# Patient Record
Sex: Male | Born: 1985 | Race: White | Hispanic: No | Marital: Married | Smoking: Never smoker
Health system: Southern US, Community
[De-identification: ages and names within clinical notes are randomized; demographics above are authoritative.]

## PROBLEM LIST (undated history)

## (undated) DIAGNOSIS — K6289 Other specified diseases of anus and rectum: Secondary | ICD-10-CM

## (undated) DIAGNOSIS — M791 Myalgia, unspecified site: Secondary | ICD-10-CM

## (undated) DIAGNOSIS — R61 Generalized hyperhidrosis: Secondary | ICD-10-CM

## (undated) DIAGNOSIS — F329 Major depressive disorder, single episode, unspecified: Secondary | ICD-10-CM

## (undated) DIAGNOSIS — M549 Dorsalgia, unspecified: Secondary | ICD-10-CM

## (undated) DIAGNOSIS — G8929 Other chronic pain: Secondary | ICD-10-CM

## (undated) DIAGNOSIS — R194 Change in bowel habit: Secondary | ICD-10-CM

## (undated) DIAGNOSIS — R5383 Other fatigue: Secondary | ICD-10-CM

## (undated) DIAGNOSIS — Z803 Family history of malignant neoplasm of breast: Secondary | ICD-10-CM

## (undated) DIAGNOSIS — N390 Urinary tract infection, site not specified: Secondary | ICD-10-CM

## (undated) DIAGNOSIS — R51 Headache: Secondary | ICD-10-CM

## (undated) DIAGNOSIS — Z801 Family history of malignant neoplasm of trachea, bronchus and lung: Secondary | ICD-10-CM

## (undated) DIAGNOSIS — Z800 Family history of malignant neoplasm of digestive organs: Secondary | ICD-10-CM

## (undated) DIAGNOSIS — R112 Nausea with vomiting, unspecified: Secondary | ICD-10-CM

## (undated) DIAGNOSIS — F32A Depression, unspecified: Secondary | ICD-10-CM

## (undated) DIAGNOSIS — R109 Unspecified abdominal pain: Secondary | ICD-10-CM

## (undated) HISTORY — DX: Other fatigue: R53.83

## (undated) HISTORY — DX: Family history of malignant neoplasm of trachea, bronchus and lung: Z80.1

## (undated) HISTORY — DX: Depression, unspecified: F32.A

## (undated) HISTORY — DX: Family history of malignant neoplasm of breast: Z80.3

## (undated) HISTORY — DX: Myalgia, unspecified site: M79.10

## (undated) HISTORY — DX: Generalized hyperhidrosis: R61

## (undated) HISTORY — DX: Urinary tract infection, site not specified: N39.0

## (undated) HISTORY — DX: Unspecified abdominal pain: R10.9

## (undated) HISTORY — DX: Headache: R51

## (undated) HISTORY — DX: Change in bowel habit: R19.4

## (undated) HISTORY — DX: Family history of malignant neoplasm of digestive organs: Z80.0

## (undated) HISTORY — DX: Other specified diseases of anus and rectum: K62.89

## (undated) HISTORY — DX: Major depressive disorder, single episode, unspecified: F32.9

## (undated) HISTORY — DX: Nausea with vomiting, unspecified: R11.2

---

## 2011-10-23 ENCOUNTER — Ambulatory Visit (INDEPENDENT_AMBULATORY_CARE_PROVIDER_SITE_OTHER): Payer: BC Managed Care – PPO | Admitting: Family Medicine

## 2011-10-23 ENCOUNTER — Encounter: Payer: Self-pay | Admitting: Family Medicine

## 2011-10-23 VITALS — BP 120/84 | HR 80 | Temp 98.4°F | Resp 12 | Ht 71.0 in | Wt 184.0 lb

## 2011-10-23 DIAGNOSIS — Z0000 Encounter for general adult medical examination without abnormal findings: Secondary | ICD-10-CM

## 2011-10-23 DIAGNOSIS — G43909 Migraine, unspecified, not intractable, without status migrainosus: Secondary | ICD-10-CM | POA: Insufficient documentation

## 2011-10-23 DIAGNOSIS — Z23 Encounter for immunization: Secondary | ICD-10-CM

## 2011-10-23 DIAGNOSIS — J310 Chronic rhinitis: Secondary | ICD-10-CM

## 2011-10-23 DIAGNOSIS — Z8659 Personal history of other mental and behavioral disorders: Secondary | ICD-10-CM | POA: Insufficient documentation

## 2011-10-23 LAB — CBC WITH DIFFERENTIAL/PLATELET
Basophils Relative: 0.4 % (ref 0.0–3.0)
Eosinophils Absolute: 0.2 10*3/uL (ref 0.0–0.7)
Eosinophils Relative: 3.1 % (ref 0.0–5.0)
Lymphocytes Relative: 25.3 % (ref 12.0–46.0)
MCHC: 34.1 g/dL (ref 30.0–36.0)
MCV: 91.1 fl (ref 78.0–100.0)
Monocytes Absolute: 0.5 10*3/uL (ref 0.1–1.0)
Neutrophils Relative %: 63.5 % (ref 43.0–77.0)
Platelets: 267 10*3/uL (ref 150.0–400.0)
RBC: 5.19 Mil/uL (ref 4.22–5.81)
WBC: 6.8 10*3/uL (ref 4.5–10.5)

## 2011-10-23 LAB — BASIC METABOLIC PANEL
BUN: 10 mg/dL (ref 6–23)
Chloride: 103 mEq/L (ref 96–112)
GFR: 119.5 mL/min (ref >60.00)
Glucose, Bld: 83 mg/dL (ref 70–99)
Potassium: 4.3 mEq/L (ref 3.5–5.1)
Sodium: 142 mEq/L (ref 135–145)

## 2011-10-23 LAB — LIPID PANEL
HDL: 33.6 mg/dL — ABNORMAL LOW (ref >39.00)
LDL Cholesterol: 103 mg/dL — ABNORMAL HIGH (ref 0–99)
VLDL: 35 mg/dL (ref 0.0–40.0)

## 2011-10-23 LAB — TSH: TSH: 1.51 u[IU]/mL (ref 0.35–5.50)

## 2011-10-23 LAB — HEPATIC FUNCTION PANEL
Bilirubin, Direct: 0.1 mg/dL (ref 0.0–0.3)
Total Bilirubin: 0.6 mg/dL (ref 0.3–1.2)

## 2011-10-23 MED ORDER — FLUTICASONE PROPIONATE 50 MCG/ACT NA SUSP: 2.0000 | Freq: Every day | NASAL | Status: DC

## 2011-10-23 NOTE — Progress Notes (Signed)
  Subjective:    Patient ID: Mark Hopkins, male    DOB: 1986/03/28, 26 y.o.   MRN: [id number]  HPI  New patient to establish care for complete physical. Past medical history reviewed. Past history of depression currently stable off medication. History of migraine headaches usually releived with over-the-counter medication. No other chronic medical problems. No prior surgeries.  Mother had some type of palpitations possible arrhythmia. Father had coronary disease with bypass age 37. Both were smokers. Both parents with history of alcohol abuse  Patient is married. No children. Nonsmoker. Only social occasional alcohol use. No regular exercise. Plans to start soon.  He has frequent nasal congestion usually alternating right and left nostril. No nasal discharge. Symptoms are year-round. Not relieved with nasal saline or antihistamine.  Last tetanus unknown   Review of Systems  Constitutional: Negative for fever, activity change, appetite change and fatigue.  HENT: Negative for ear pain, congestion and trouble swallowing.   Eyes: Negative for pain and visual disturbance.  Respiratory: Negative for cough, shortness of breath and wheezing.   Cardiovascular: Negative for chest pain and palpitations.  Gastrointestinal: Negative for nausea, vomiting, abdominal pain, diarrhea, constipation, blood in stool, abdominal distention and rectal pain.  Genitourinary: Negative for dysuria, hematuria and testicular pain.  Musculoskeletal: Negative for joint swelling and arthralgias.  Skin: Negative for rash.  Neurological: Negative for dizziness, syncope and headaches.  Hematological: Negative for adenopathy.  Psychiatric/Behavioral: Negative for confusion and dysphoric mood.       Objective:   Physical Exam  Constitutional: He is oriented to person, place, and time. He appears well-developed and well-nourished. No distress.  HENT:  Head: Normocephalic and atraumatic.  Right Ear: External ear  normal.  Left Ear: External ear normal.  Mouth/Throat: Oropharynx is clear and moist.  Eyes: Conjunctivae and EOM are normal. Pupils are equal, round, and reactive to light.  Neck: Normal range of motion. Neck supple. No thyromegaly present.  Cardiovascular: Normal rate, regular rhythm and normal heart sounds.   No murmur heard. Pulmonary/Chest: No respiratory distress. He has no wheezes. He has no rales.  Abdominal: Soft. Bowel sounds are normal. He exhibits no distension and no mass. There is no tenderness. There is no rebound and no guarding.  Musculoskeletal: He exhibits no edema.  Lymphadenopathy:    He has no cervical adenopathy.  Neurological: He is alert and oriented to person, place, and time. He displays normal reflexes. No cranial nerve deficit.  Skin: No rash noted.  Psychiatric: He has a normal mood and affect.          Assessment & Plan:  Healthy 26 year old male. Needs tetanus booster. Obtain screening lab work. Flonase for nasal congestion symptoms. Establish more regular exercise.

## 2011-10-23 NOTE — Patient Instructions (Signed)
Establish more consistent exercise

## 2011-10-25 NOTE — Progress Notes (Signed)
Quick Note:  Pt informed ______ 

## 2012-04-20 ENCOUNTER — Encounter: Payer: Self-pay | Admitting: Family Medicine

## 2012-04-20 ENCOUNTER — Ambulatory Visit (INDEPENDENT_AMBULATORY_CARE_PROVIDER_SITE_OTHER): Payer: BC Managed Care – PPO | Admitting: Family Medicine

## 2012-04-20 VITALS — BP 120/80 | HR 100 | Temp 98.3°F | Wt 179.0 lb

## 2012-04-20 DIAGNOSIS — J069 Acute upper respiratory infection, unspecified: Secondary | ICD-10-CM

## 2012-04-20 MED ORDER — FLUTICASONE PROPIONATE 50 MCG/ACT NA SUSP: 2.0000 | Freq: Every day | NASAL | Status: DC

## 2012-04-20 MED ORDER — BENZONATATE 100 MG PO CAPS: 100.0000 mg | ORAL_CAPSULE | Freq: Two times a day (BID) | ORAL | Status: DC | PRN

## 2012-04-20 NOTE — Progress Notes (Signed)
Chief Complaint  Patient presents with  . Sore Throat    cough, chest hurts, body ahces, chills since Sunday     HPI:  URI: -started 2 days ago -symptoms: cough, drainage, nasal congestion, cp from coughing, hoarseness, scratchy sore throat, chills on and off, body aches he thinks from coughing -Denies: fevers, NVD, tooth pain, ear pain -has only tried cough drops  ROS: See pertinent positives and negatives per HPI.  Past Medical History  Diagnosis Date  . Depression   . Headache   . Urinary tract infection   . Migraine     Family History  Problem Relation Age of Onset  . Alcohol abuse Mother   . Mental illness Mother   . Heart disease Mother     palpitations  . Alcohol abuse Father   . Heart disease Father 37    CAD  . Alcohol abuse Maternal Grandmother   . Alcohol abuse Maternal Grandfather   . Alcohol abuse Paternal Grandmother   . Alcohol abuse Paternal Grandfather     History   Social History  . Marital Status: Married    Spouse Name: N/A    Number of Children: N/A  . Years of Education: N/A   Social History Main Topics  . Smoking status: Never Smoker   . Smokeless tobacco: None  . Alcohol Use: None  . Drug Use: None  . Sexually Active: None   Other Topics Concern  . None   Social History Narrative  . None    Current outpatient prescriptions:benzonatate (TESSALON) 100 MG capsule, Take 1 capsule (100 mg total) by mouth 2 (two) times daily as needed for cough., Disp: 20 capsule, Rfl: 0;  fluticasone (FLONASE) 50 MCG/ACT nasal spray, Place 2 sprays into the nose daily., Disp: 16 g, Rfl: 1;  DISCONTD: fluticasone (FLONASE) 50 MCG/ACT nasal spray, Place 2 sprays into the nose daily., Disp: 16 g, Rfl: 6  EXAM:  Filed Vitals:   04/20/12 0913  BP: 120/80  Pulse: 100  Temp: 98.3 F (36.8 C)    There is no height on file to calculate BMI.  GENERAL: vitals reviewed and listed above, alert, oriented, appears well hydrated and in no acute  distress  HEENT: atraumatic, conjunttiva clear, no obvious abnormalities on inspection of external nose and ears, normal ear canals and TMs, clear nasal congestion, PND, post oropharyngeal erythema  NECK: no obvious masses on inspection  LUNGS: clear to auscultation bilaterally, no wheezes, rales or rhonchi, good air movement  CV: HRRR, no peripheral edema  MS: moves all extremities without noticeable abnormality  PSYCH: pleasant and cooperative, no obvious depression or anxiety  ASSESSMENT AND PLAN:  Discussed the following assessment and plan:  1. Viral upper respiratory illness  fluticasone (FLONASE) 50 MCG/ACT nasal spray, benzonatate (TESSALON) 100 MG capsule    -Patient advised to return or notify a doctor immediately if symptoms worsen or persist or new concerns arise.  Patient Instructions  NSTRUCTIONS FOR UPPER RESPIRATORY INFECTION:  -plenty of rest and fluids  -nasal saline wash 2-3 times daily (use prepackaged nasal saline or bottled/distilled water if making your own)   -clean nose with nasal saline before using the nasal steroid or sinex  -can use sinex nasal spray for drainage and nasal congestion - but do NOT use longer then 3-4 days  -can use tylenol or ibuprofen as directed for aches and sorethroat  -in the winter time, using a humidifier at night is helpful (please follow cleaning instructions)  -if you are  taking a cough medication - use only as directed, may also try a teaspoon of honey to coat the throat and throat lozenges  -for sore throat, salt water gargles can help  -follow up if you have fevers, are worsening or not getting better in 5-7 days      KIM, HANNAH R.

## 2012-04-20 NOTE — Patient Instructions (Signed)
NSTRUCTIONS FOR UPPER RESPIRATORY INFECTION:  -plenty of rest and fluids  -nasal saline wash 2-3 times daily (use prepackaged nasal saline or bottled/distilled water if making your own)   -clean nose with nasal saline before using the nasal steroid or sinex  -can use sinex nasal spray for drainage and nasal congestion - but do NOT use longer then 3-4 days  -can use tylenol or ibuprofen as directed for aches and sorethroat  -in the winter time, using a humidifier at night is helpful (please follow cleaning instructions)  -if you are taking a cough medication - use only as directed, may also try a teaspoon of honey to coat the throat and throat lozenges  -for sore throat, salt water gargles can help  -follow up if you have fevers, are worsening or not getting better in 5-7 days

## 2014-05-02 ENCOUNTER — Ambulatory Visit: Payer: Self-pay

## 2015-03-01 ENCOUNTER — Emergency Department (HOSPITAL_COMMUNITY)
Admission: EM | Admit: 2015-03-01 | Discharge: 2015-03-01 | Disposition: A | Discharge status: Home / Self Care | Payer: BLUE CROSS/BLUE SHIELD | Attending: Emergency Medicine | Admitting: Emergency Medicine

## 2015-03-01 ENCOUNTER — Encounter (HOSPITAL_COMMUNITY): Payer: Self-pay | Admitting: Emergency Medicine

## 2015-03-01 DIAGNOSIS — G8929 Other chronic pain: Secondary | ICD-10-CM | POA: Insufficient documentation

## 2015-03-01 DIAGNOSIS — M545 Low back pain, unspecified: Secondary | ICD-10-CM

## 2015-03-01 HISTORY — DX: Other chronic pain: G89.29

## 2015-03-01 HISTORY — DX: Dorsalgia, unspecified: M54.9

## 2015-03-01 MED ORDER — PREDNISONE 10 MG PO TABS: 20.0000 mg | ORAL_TABLET | Freq: Every day | ORAL | Status: DC

## 2015-03-01 MED ORDER — CYCLOBENZAPRINE HCL 10 MG PO TABS: 5.0000 mg | ORAL_TABLET | Freq: Two times a day (BID) | ORAL | Status: DC | PRN

## 2015-03-01 MED ORDER — DIAZEPAM 5 MG PO TABS
5.0000 mg | ORAL_TABLET | Freq: Once | ORAL | Status: AC
  Administered 2015-03-01: 5 mg via ORAL
  Filled 2015-03-01: qty 1

## 2015-03-01 MED ORDER — KETOROLAC TROMETHAMINE 60 MG/2ML IM SOLN
60.0000 mg | Freq: Once | INTRAMUSCULAR | Status: AC
  Administered 2015-03-01: 60 mg via INTRAMUSCULAR
  Filled 2015-03-01: qty 2

## 2015-03-01 NOTE — ED Notes (Addendum)
Patient states low to mid back pain and 2 weeks.   Denies injury.   Patient states has not been taking any medicine at home.   Patient states 8/10 pain "feels like electric shocks".  Patient states "i have had chronic back pain since I was a teenager".

## 2015-03-01 NOTE — ED Provider Notes (Signed)
CSN: [id number]     Arrival date & time 03/01/15  1030 History  This chart was scribed for non-physician practitioner, Linus Mako, working with Blanchie Dessert, MD by Evelene Croon, ED Scribe. This patient was seen in room TR07C/TR07C and the patient's care was started at 11:32 AM.    Chief Complaint  Patient presents with  . Back Pain    The history is provided by the patient. No language interpreter was used.     HPI Comments:  Mark Hopkins is a 29 y.o. male who presents to the Emergency Department complaining of lower back pain for a few weeks. He describes his pain as  episodic "shocks" that shoot downwards but does not go into his BLE. He notes sitting and laying supine exacerbates his pain; while standing and laying on a harder surface mildly improves the pain. He denies urinary symptoms, bowel/bladder incontinence, numbness and weaknss to his BLE. Pt has a h/o chronic back pain but notes his pain today is different. He also reports 2 episodes of vertigo PTA and at the time his pain was ~ 7/10 and at its peak of intensity.   Past Medical History  Diagnosis Date  . Chronic back pain    History reviewed. No pertinent past surgical history. No family history on file. Social History  Substance Use Topics  . Smoking status: Never Smoker   . Smokeless tobacco: None  . Alcohol Use: Yes     Comment: socially    Review of Systems  Genitourinary: Negative for dysuria.  Neurological: Positive for dizziness. Negative for weakness and numbness.  All other systems reviewed and are negative.    Allergies  Review of patient's allergies indicates no known allergies.  Home Medications   Prior to Admission medications   Medication Sig Start Date End Date Taking? Authorizing Provider  cyclobenzaprine (FLEXERIL) 10 MG tablet Take 0.5-1 tablets (5-10 mg total) by mouth 2 (two) times daily as needed for muscle spasms. 03/01/15   Thomasina Housley Carlota Raspberry, PA-C  predniSONE (DELTASONE) 10  MG tablet Take 2 tablets (20 mg total) by mouth daily. 03/01/15   Ceazia Harb Carlota Raspberry, PA-C   BP 131/100 mmHg  Pulse 86  Temp(Src) 97.9 F (36.6 C) (Oral)  Resp 12  Ht 5\' 11"  (1.803 m)  Wt 185 lb (83.915 kg)  BMI 25.81 kg/m2  SpO2 100% Physical Exam  Constitutional: He is oriented to person, place, and time. He appears well-developed and well-nourished. No distress.  HENT:  Head: Normocephalic and atraumatic.  Eyes: Conjunctivae are normal.  Neck: Normal range of motion.  Cardiovascular: Normal rate.   Pulmonary/Chest: Effort normal.  Musculoskeletal: Normal range of motion.  Pt has symmetrical and physiologic strength to bilateral lower extremities.  Neurosensory function adequate to both legs Skin color is normal. Skin is warm and moist.  No step off deformity appreciated and she has no midline bony tenderness.  Pt is able to ambulate but with some discomfort.  No crepitus, laceration, effusion, induration, lesions,  appreciated Pedal pulses are symmetrical and palpable bilaterally  he exhibits tenderness to the paraspinal region but non midline   Neurological: He is alert and oriented to person, place, and time.  Skin: Skin is warm and dry.  Psychiatric: He has a normal mood and affect. His behavior is normal.  Nursing note and vitals reviewed.   ED Course  Procedures   DIAGNOSTIC STUDIES:  Oxygen Saturation is 100% on RA, normal by my interpretation.    COORDINATION OF CARE:  11:36 AM Will administer dose of PO Valium and a shot of toradol. Will discharge with RX for prednisone and muscle relaxers, Tylenol at home for pain and referral to Ortho. Discussed treatment plan with pt at bedside and pt agreed to plan.  Labs Review Labs Reviewed - No data to display  Imaging Review No results found. I have personally reviewed and evaluated these images and lab results as part of my medical decision-making.   EKG Interpretation None      MDM   Final diagnoses:   Midline low back pain without sciatica   29 y.o.Mark Hopkins's  with back pain.   No neurological deficits and normal neuro exam. No loss of bowel or bladder control. No concern for cauda equina at this time base on HPI and physical exam findings. No fever, night sweats, weight loss, h/o cancer, IVDU. The patient can walk with some discomfort.   Patient Plan 1. Medications: NSAIDs and/or muscle relaxer. Cont usual home medications unless otherwise directed. 2. Treatment: rest, drink plenty of fluids, gentle stretching as discussed, alternate ice and heat  3. Follow Up: Please followup with your primary doctor for discussion of your diagnoses and further evaluation after today's visit; if you do not have a primary care doctor use the resource guide provided to find one  Advised to follow-up with the orthopedist if symptoms do not start to resolve in the next 2-3 days. If develop loss of bowel or urinary control return to the ED as soon as possible for further evaluation. To take the medications as prescribed as they can cause harm if not taken appropriately.   Vital signs are stable at discharge. Filed Vitals:   03/01/15 1142  BP: 137/102  Pulse: 82  Temp:   Resp: 14    Patient/guardian has voiced understanding and agreed to follow-up with the PCP or specialist.   I personally performed the services described in this documentation, which was scribed in my presence. The recorded information has been reviewed and is accurate.   Delos Haring, PA-C 03/01/15 1143  Blanchie Dessert, MD 03/01/15 2230

## 2015-03-01 NOTE — Discharge Instructions (Signed)
Back Pain, Adult Low back pain is very common. About 1 in 5 people have back pain.The cause of low back pain is rarely dangerous. The pain often gets better over time.About half of people with a sudden onset of back pain feel better in just 2 weeks. About 8 in 10 people feel better by 6 weeks.  CAUSES Some common causes of back pain include:  Strain of the muscles or ligaments supporting the spine.  Wear and tear (degeneration) of the spinal discs.  Arthritis.  Direct injury to the back. DIAGNOSIS Most of the time, the direct cause of low back pain is not known.However, back pain can be treated effectively even when the exact cause of the pain is unknown.Answering your caregiver's questions about your overall health and symptoms is one of the most accurate ways to make sure the cause of your pain is not dangerous. If your caregiver needs more information, he or she may order lab work or imaging tests (X-rays or MRIs).However, even if imaging tests show changes in your back, this usually does not require surgery. HOME CARE INSTRUCTIONS For many people, back pain returns.Since low back pain is rarely dangerous, it is often a condition that people can learn to manageon their own.   Remain active. It is stressful on the back to sit or stand in one place. Do not sit, drive, or stand in one place for more than 30 minutes at a time. Take short walks on level surfaces as soon as pain allows.Try to increase the length of time you walk each day.  Do not stay in bed.Resting more than 1 or 2 days can delay your recovery.  Do not avoid exercise or work.Your body is made to move.It is not dangerous to be active, even though your back may hurt.Your back will likely heal faster if you return to being active before your pain is gone.  Pay attention to your body when you bend and lift. Many people have less discomfortwhen lifting if they bend their knees, keep the load close to their bodies,and  avoid twisting. Often, the most comfortable positions are those that put less stress on your recovering back.  Find a comfortable position to sleep. Use a firm mattress and lie on your side with your knees slightly bent. If you lie on your back, put a pillow under your knees.  Only take over-the-counter or prescription medicines as directed by your caregiver. Over-the-counter medicines to reduce pain and inflammation are often the most helpful.Your caregiver may prescribe muscle relaxant drugs.These medicines help dull your pain so you can more quickly return to your normal activities and healthy exercise.  Put ice on the injured area.  Put ice in a plastic bag.  Place a towel between your skin and the bag.  Leave the ice on for 15-20 minutes, 03-04 times a day for the first 2 to 3 days. After that, ice and heat may be alternated to reduce pain and spasms.  Ask your caregiver about trying back exercises and gentle massage. This may be of some benefit.  Avoid feeling anxious or stressed.Stress increases muscle tension and can worsen back pain.It is important to recognize when you are anxious or stressed and learn ways to manage it.Exercise is a great option. SEEK MEDICAL CARE IF:  You have pain that is not relieved with rest or medicine.  You have pain that does not improve in 1 week.  You have new symptoms.  You are generally not feeling well. SEEK   IMMEDIATE MEDICAL CARE IF:   You have pain that radiates from your back into your legs.  You develop new bowel or bladder control problems.  You have unusual weakness or numbness in your arms or legs.  You develop nausea or vomiting.  You develop abdominal pain.  You feel faint. Document Released: 06/24/2005 Document Revised: 12/24/2011 Document Reviewed: 10/26/2013 Exit[employer] Patient Information 2015 [employer]. This information is not intended to replace advice given to you by your health care provider. Make sure you  discuss any questions you have with your health care provider.  

## 2015-03-02 ENCOUNTER — Encounter: Payer: Self-pay | Admitting: Family Medicine

## 2015-08-02 ENCOUNTER — Emergency Department (HOSPITAL_COMMUNITY)
Admission: EM | Admit: 2015-08-02 | Discharge: 2015-08-02 | Disposition: A | Discharge status: Home / Self Care | Payer: BLUE CROSS/BLUE SHIELD | Attending: Emergency Medicine | Admitting: Emergency Medicine

## 2015-08-02 ENCOUNTER — Emergency Department (HOSPITAL_COMMUNITY): Payer: BLUE CROSS/BLUE SHIELD

## 2015-08-02 ENCOUNTER — Encounter (HOSPITAL_COMMUNITY): Payer: Self-pay | Admitting: Neurology

## 2015-08-02 DIAGNOSIS — N12 Tubulo-interstitial nephritis, not specified as acute or chronic: Secondary | ICD-10-CM | POA: Insufficient documentation

## 2015-08-02 DIAGNOSIS — R319 Hematuria, unspecified: Secondary | ICD-10-CM | POA: Diagnosis present

## 2015-08-02 DIAGNOSIS — Z8659 Personal history of other mental and behavioral disorders: Secondary | ICD-10-CM | POA: Diagnosis not present

## 2015-08-02 DIAGNOSIS — Z8679 Personal history of other diseases of the circulatory system: Secondary | ICD-10-CM | POA: Diagnosis not present

## 2015-08-02 DIAGNOSIS — Z87440 Personal history of urinary (tract) infections: Secondary | ICD-10-CM | POA: Insufficient documentation

## 2015-08-02 DIAGNOSIS — G8929 Other chronic pain: Secondary | ICD-10-CM | POA: Diagnosis not present

## 2015-08-02 DIAGNOSIS — R109 Unspecified abdominal pain: Secondary | ICD-10-CM

## 2015-08-02 LAB — I-STAT CHEM 8, ED
BUN: 10 mg/dL (ref 6–20)
CALCIUM ION: 1.16 mmol/L (ref 1.12–1.23)
CREATININE: 0.9 mg/dL (ref 0.61–1.24)
Chloride: 100 mmol/L — ABNORMAL LOW (ref 101–111)
Glucose, Bld: 89 mg/dL (ref 65–99)
HEMATOCRIT: 50 % (ref 39.0–52.0)
Hemoglobin: 17 g/dL (ref 13.0–17.0)
Potassium: 4.3 mmol/L (ref 3.5–5.1)
Sodium: 139 mmol/L (ref 135–145)
TCO2: 24 mmol/L (ref 0–100)

## 2015-08-02 LAB — COMPREHENSIVE METABOLIC PANEL
ALT: 23 U/L (ref 17–63)
AST: 20 U/L (ref 15–41)
Albumin: 3.8 g/dL (ref 3.5–5.0)
Alkaline Phosphatase: 68 U/L (ref 38–126)
Anion gap: 10 (ref 5–15)
BUN: 8 mg/dL (ref 6–20)
CHLORIDE: 102 mmol/L (ref 101–111)
CO2: 27 mmol/L (ref 22–32)
Calcium: 9.3 mg/dL (ref 8.9–10.3)
Creatinine, Ser: 0.98 mg/dL (ref 0.61–1.24)
GFR calc Af Amer: >60 mL/min (ref >60)
GFR calc non Af Amer: >60 mL/min (ref >60)
Glucose, Bld: 90 mg/dL (ref 65–99)
Potassium: 4.1 mmol/L (ref 3.5–5.1)
SODIUM: 139 mmol/L (ref 135–145)
Total Bilirubin: 0.6 mg/dL (ref 0.3–1.2)
Total Protein: 7.5 g/dL (ref 6.5–8.1)

## 2015-08-02 LAB — URINALYSIS, ROUTINE W REFLEX MICROSCOPIC
Glucose, UA: NEGATIVE mg/dL
Ketones, ur: 15 mg/dL — AB
Nitrite: POSITIVE — AB
PROTEIN: 100 mg/dL — AB
Specific Gravity, Urine: 1.034 — ABNORMAL HIGH (ref 1.005–1.030)
pH: 6 (ref 5.0–8.0)

## 2015-08-02 LAB — URINE MICROSCOPIC-ADD ON

## 2015-08-02 MED ORDER — SODIUM CHLORIDE 0.9 % IV BOLUS (SEPSIS)
1000.0000 mL | Freq: Once | INTRAVENOUS | Status: AC
  Administered 2015-08-02: 1000 mL via INTRAVENOUS

## 2015-08-02 MED ORDER — ONDANSETRON 4 MG PO TBDP: 4.0000 mg | ORAL_TABLET | Freq: Three times a day (TID) | ORAL | Status: DC | PRN

## 2015-08-02 MED ORDER — CEPHALEXIN 500 MG PO CAPS: 500.0000 mg | ORAL_CAPSULE | Freq: Three times a day (TID) | ORAL | Status: AC

## 2015-08-02 NOTE — ED Notes (Signed)
Reports this morning a few drops of blood in his urine, had upper back pain starting yesterday.

## 2015-08-02 NOTE — ED Notes (Signed)
PT transported to CT at this time.

## 2015-08-02 NOTE — ED Provider Notes (Signed)
CSN: [id number]     Arrival date & time 08/02/15  0932 History   First MD Initiated Contact with Patient 08/02/15 0957     Chief Complaint  Patient presents with  . Hematuria     (Consider location/radiation/quality/duration/timing/severity/associated sxs/prior Treatment) HPI Comments: Back pain x1 day Hematuria this AM R lower thoracic/flank pain Pain 8/10 yesterday and shoot to right buttock Now 3/10 not radiating No numbness/tingling +dysuria No discharge, pain with intercourse No prior testing for STIs Back pain in past, was bilateral, spinal pain Yesterday took hydrocodone left over from other dr. Visit, helped   Patient is a 30 y.o. male presenting with hematuria.  Hematuria Pertinent negatives include no chest pain, no abdominal pain, no headaches and no shortness of breath.    Past Medical History  Diagnosis Date  . Depression   . Headache(784.0)   . Urinary tract infection   . Migraine   . Chronic back pain    History reviewed. No pertinent past surgical history. Family History  Problem Relation Age of Onset  . Alcohol abuse Mother   . Mental illness Mother   . Heart disease Mother     palpitations  . Alcohol abuse Father   . Heart disease Father 72    CAD  . Alcohol abuse Maternal Grandmother   . Alcohol abuse Maternal Grandfather   . Alcohol abuse Paternal Grandmother   . Alcohol abuse Paternal Grandfather    Social History  Substance Use Topics  . Smoking status: Never Smoker   . Smokeless tobacco: None  . Alcohol Use: Yes     Comment: socially    Review of Systems  Constitutional: Negative for fever.  HENT: Negative for sore throat.   Eyes: Negative for visual disturbance.  Respiratory: Negative for shortness of breath.   Cardiovascular: Negative for chest pain.  Gastrointestinal: Negative for nausea, vomiting, abdominal pain and diarrhea.  Genitourinary: Positive for dysuria, hematuria and flank pain. Negative for difficulty urinating.   Musculoskeletal: Positive for back pain. Negative for neck stiffness.  Skin: Negative for rash.  Neurological: Negative for syncope and headaches.      Allergies  Review of patient's allergies indicates no known allergies.  Home Medications   Prior to Admission medications   Medication Sig Start Date End Date Taking? Authorizing Provider  HYDROcodone-acetaminophen (NORCO/VICODIN) 5-325 MG tablet Take 1 tablet by mouth every 6 (six) hours as needed for moderate pain.   Yes Historical Provider, MD  ibuprofen (ADVIL,MOTRIN) 400 MG tablet Take 400 mg by mouth every 6 (six) hours as needed for mild pain.   Yes Historical Provider, MD  cephALEXin (KEFLEX) 500 MG capsule Take 1 capsule (500 mg total) by mouth 3 (three) times daily. 08/02/15 08/16/15  Gareth Morgan, MD  ondansetron (ZOFRAN ODT) 4 MG disintegrating tablet Take 1 tablet (4 mg total) by mouth every 8 (eight) hours as needed for nausea or vomiting. 08/02/15   Gareth Morgan, MD   BP 133/82 mmHg  Pulse 98  Temp(Src) 98.5 F (36.9 C) (Oral)  Resp 16  SpO2 96% Physical Exam  Constitutional: He is oriented to person, place, and time. He appears well-developed and well-nourished. No distress.  HENT:  Head: Normocephalic and atraumatic.  Eyes: Conjunctivae and EOM are normal.  Neck: Normal range of motion.  Cardiovascular: Normal rate, regular rhythm, normal heart sounds and intact distal pulses.  Exam reveals no gallop and no friction rub.   No murmur heard. Pulmonary/Chest: Effort normal and breath sounds normal. No respiratory  distress. He has no wheezes. He has no rales.  Abdominal: Soft. He exhibits no distension. There is no tenderness. There is CVA tenderness (R). There is no guarding.  Musculoskeletal: He exhibits no edema.  Neurological: He is alert and oriented to person, place, and time.  Skin: Skin is warm and dry. He is not diaphoretic.  Nursing note and vitals reviewed.   ED Course  Procedures (including  critical care time) Labs Review Labs Reviewed  URINALYSIS, ROUTINE W REFLEX MICROSCOPIC (NOT AT [facility]) - Abnormal; Notable for the following:    Color, Urine RED (*)    APPearance TURBID (*)    Specific Gravity, Urine 1.034 (*)    Hgb urine dipstick LARGE (*)    Bilirubin Urine MODERATE (*)    Ketones, ur 15 (*)    Protein, ur 100 (*)    Nitrite POSITIVE (*)    Leukocytes, UA LARGE (*)    All other components within normal limits  URINE MICROSCOPIC-ADD ON - Abnormal; Notable for the following:    Squamous Epithelial / LPF 0-5 (*)    Bacteria, UA MANY (*)    All other components within normal limits  I-STAT CHEM 8, ED - Abnormal; Notable for the following:    Chloride 100 (*)    All other components within normal limits  URINE CULTURE  COMPREHENSIVE METABOLIC PANEL  GC/CHLAMYDIA PROBE AMP ([facility]) NOT AT [facility]    Imaging Review Ct Renal Stone Study  08/02/2015  CLINICAL DATA:  Right flank pain since yesterday.  Gross hematuria. EXAM: CT ABDOMEN AND PELVIS WITHOUT CONTRAST TECHNIQUE: Multidetector CT imaging of the abdomen and pelvis was performed following the standard protocol without IV contrast. COMPARISON:  None. FINDINGS: Lower chest:  Minimal bilateral dependent atelectasis. Hepatobiliary: Normal appearing gallbladder and liver. Pancreas: No mass or inflammatory process identified on this un-enhanced exam. Spleen: Within normal limits in size. Adrenals/Urinary Tract: Normal appearing adrenal glands. Tiny mid to lower left renal calculus. No right renal, ureteral or bladder calculi. No hydronephrosis. Stomach/Bowel: No gastrointestinal abnormalities. Normal appearing appendix. Vascular/Lymphatic: No pathologically enlarged lymph nodes. No evidence of abdominal aortic aneurysm. Reproductive: No mass or other significant abnormality. Other: Small umbilical hernia containing fat. Musculoskeletal: Bilateral L5 pars interarticularis defects with associated 5 mm of anterolisthesis at the  L5-S1 level. IMPRESSION: 1. Tiny, nonobstructing left renal calculus. 2. 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 Electronically Signed   By: Claudie Revering M.D.   On: 08/02/2015 11:40   I have personally reviewed and evaluated these images and lab results as part of my medical decision-making.   EKG Interpretation None      MDM   Final diagnoses:  Right flank pain  Pyelonephritis   30yo male with no significant medical history presents with concern for right flank pain, dysuria.  CT stone study shows nonobstructive left nephrolithiasis, no other abnormalities. Urinalysis concerning for UTI, and based on symptoms, right sided flank pain, will treat with keflex for 2 weeks for pyelonephritis. Patient discharged in stable condition with understanding of reasons to return.     Gareth Morgan, MD 08/02/15 2235

## 2015-08-03 LAB — GC/CHLAMYDIA PROBE AMP (~~LOC~~) NOT AT ARMC
CHLAMYDIA, DNA PROBE: NEGATIVE
NEISSERIA GONORRHEA: NEGATIVE

## 2015-08-05 LAB — URINE CULTURE

## 2015-08-07 ENCOUNTER — Telehealth (HOSPITAL_COMMUNITY): Payer: Self-pay

## 2015-08-07 NOTE — Telephone Encounter (Signed)
Post ED Visit - Positive Culture Follow-up  Culture report reviewed by antimicrobial stewardship pharmacist:  []  Elenor Quinones, Pharm.D. []  Heide Guile, Pharm.D., BCPS [x]  Parks Neptune, Pharm.D. []  Alycia Rossetti, Pharm.D., BCPS []  Barclay, Pharm.D., BCPS, AAHIVP []  Legrand Como, Pharm.D., BCPS, AAHIVP []  Milus Glazier, Pharm.D. []  Stephens November, [place].D.  Positive urine culture, 80,000 colonies -> E Coli Treated with Cephalexin, organism sensitive to the same and no further patient follow-up is required at this time.  Dortha Kern 08/07/2015, 3:18 AM

## 2017-05-16 ENCOUNTER — Emergency Department (HOSPITAL_COMMUNITY)
Admission: EM | Admit: 2017-05-16 | Discharge: 2017-05-16 | Disposition: A | Discharge status: Home / Self Care | Payer: Managed Care, Other (non HMO) | Attending: Emergency Medicine | Admitting: Emergency Medicine

## 2017-05-16 ENCOUNTER — Encounter (HOSPITAL_COMMUNITY): Payer: Self-pay | Admitting: *Deleted

## 2017-05-16 ENCOUNTER — Emergency Department (HOSPITAL_COMMUNITY): Payer: Managed Care, Other (non HMO)

## 2017-05-16 ENCOUNTER — Other Ambulatory Visit: Payer: Self-pay

## 2017-05-16 DIAGNOSIS — T148XXA Other injury of unspecified body region, initial encounter: Secondary | ICD-10-CM

## 2017-05-16 DIAGNOSIS — M62838 Other muscle spasm: Secondary | ICD-10-CM | POA: Diagnosis not present

## 2017-05-16 DIAGNOSIS — F329 Major depressive disorder, single episode, unspecified: Secondary | ICD-10-CM | POA: Insufficient documentation

## 2017-05-16 DIAGNOSIS — R079 Chest pain, unspecified: Secondary | ICD-10-CM | POA: Diagnosis present

## 2017-05-16 DIAGNOSIS — R0789 Other chest pain: Secondary | ICD-10-CM

## 2017-05-16 LAB — CBC
HCT: 45.6 % (ref 39.0–52.0)
Hemoglobin: 14.9 g/dL (ref 13.0–17.0)
MCH: 28.8 pg (ref 26.0–34.0)
MCHC: 32.7 g/dL (ref 30.0–36.0)
MCV: 88.2 fL (ref 78.0–100.0)
Platelets: 337 10*3/uL (ref 150–400)
RBC: 5.17 MIL/uL (ref 4.22–5.81)
RDW: 13 % (ref 11.5–15.5)
WBC: 6.9 10*3/uL (ref 4.0–10.5)

## 2017-05-16 LAB — BASIC METABOLIC PANEL
ANION GAP: 7 (ref 5–15)
BUN: 8 mg/dL (ref 6–20)
CALCIUM: 9.6 mg/dL (ref 8.9–10.3)
CO2: 27 mmol/L (ref 22–32)
CREATININE: 0.9 mg/dL (ref 0.61–1.24)
Chloride: 104 mmol/L (ref 101–111)
GFR calc non Af Amer: >60 mL/min (ref >60)
Glucose, Bld: 94 mg/dL (ref 65–99)
Potassium: 4 mmol/L (ref 3.5–5.1)
SODIUM: 138 mmol/L (ref 135–145)

## 2017-05-16 LAB — I-STAT TROPONIN, ED: TROPONIN I, POC: 0 ng/mL (ref 0.00–0.08)

## 2017-05-16 NOTE — Discharge Instructions (Addendum)
You may use over-the-counter Motrin (Ibuprofen), Acetaminophen (Tylenol), topical muscle creams such as SalonPas, Icy Hot, Bengay, etc. Please stretch, apply heat, and have massage therapy for additional assistance. ° °

## 2017-05-16 NOTE — ED Triage Notes (Signed)
Pt reports onset last night of chest tightness into his left arm and neck, having episodes of diaphoresis.

## 2017-05-16 NOTE — ED Provider Notes (Signed)
[facility] EMERGENCY DEPARTMENT Provider Note  CSN: [id number] Arrival date & time: 05/16/17 1018  Chief Complaint(s) Chest Pain  HPI Mark Hopkins is a 31 y.o. male   The history is provided by the patient.  Chest Pain   This is a new problem. The current episode started 3 to 5 hours ago. Episode frequency: intermittent\ The problem has been resolved. The pain is present in the substernal region. The pain is moderate. The quality of the pain is described as dull and pressure-like. Radiates to: also has left arm and neck pain that began last night. The symptoms are aggravated by certain positions. Associated symptoms include diaphoresis, nausea, palpitations and shortness of breath. Pertinent negatives include no cough, no fever, no hemoptysis, no irregular heartbeat, no leg pain, no lower extremity edema and no vomiting. He has tried nothing for the symptoms. Risk factors include male gender.  Pertinent negatives for past medical history include no CAD, no diabetes, no hyperlipidemia, no hypertension, no MI and no PE.  His family medical history is significant for early MI.  Procedure history is negative for exercise treadmill test.   Believes that the neck and arm pain are muscular in nature. Unsure about the chest pain.  Past Medical History Past Medical History:  Diagnosis Date  . Chronic back pain   . Depression   . Headache(784.0)   . Migraine   . Urinary tract infection    Patient Active Problem List   Diagnosis Date Noted  . Migraine 10/23/2011  . History of depression 10/23/2011   Home Medication(s) Prior to Admission medications   Medication Sig Start Date End Date Taking? Authorizing Provider  HYDROcodone-acetaminophen (NORCO/VICODIN) 5-325 MG tablet Take 1 tablet by mouth every 6 (six) hours as needed for moderate pain.    [provider]  ibuprofen (ADVIL,MOTRIN) 400 MG tablet Take 400 mg by mouth every 6 (six) hours as needed for  mild pain.    [provider]  ondansetron (ZOFRAN ODT) 4 MG disintegrating tablet Take 1 tablet (4 mg total) by mouth every 8 (eight) hours as needed for nausea or vomiting. 08/02/15   Gareth Morgan, MD                                                                                                                                    Past Surgical History History reviewed. No pertinent surgical history. Family History Family History  Problem Relation Age of Onset  . Alcohol abuse Mother   . Mental illness Mother   . Heart disease Mother        palpitations  . Alcohol abuse Father   . Heart disease Father 63       CAD  . Alcohol abuse Maternal Grandmother   . Alcohol abuse Maternal Grandfather   . Alcohol abuse Paternal Grandmother   . Alcohol abuse Paternal Grandfather     Social History Social  History   Tobacco Use  . Smoking status: Never Smoker  Substance Use Topics  . Alcohol use: Yes    Comment: socially  . Drug use: No   Allergies Patient has no known allergies.  Review of Systems Review of Systems  Constitutional: Positive for diaphoresis. Negative for fever.  Respiratory: Positive for shortness of breath. Negative for cough and hemoptysis.   Cardiovascular: Positive for chest pain and palpitations.  Gastrointestinal: Positive for nausea. Negative for vomiting.   All other systems are reviewed and are negative for acute change except as noted in the HPI  Physical Exam Vital Signs  I have reviewed the triage vital signs BP (!) 148/97   Pulse 84   Temp 98.3 F (36.8 C) (Oral)   Resp 17   SpO2 100%   Physical Exam  Constitutional: He is oriented to person, place, and time. He appears well-developed and well-nourished. No distress.  HENT:  Head: Normocephalic and atraumatic.  Nose: Nose normal.  Eyes: Conjunctivae and EOM are normal. Pupils are equal, round, and reactive to light. Right eye exhibits no discharge. Left eye exhibits no discharge.  No scleral icterus.  Neck: Normal range of motion. Neck supple.  Cardiovascular: Normal rate and regular rhythm. Exam reveals no gallop and no friction rub.  No murmur heard. Pulmonary/Chest: Effort normal and breath sounds normal. No stridor. No respiratory distress. He has no rales.  Abdominal: Soft. He exhibits no distension. There is no tenderness.  Musculoskeletal: He exhibits no edema.       Cervical back: He exhibits tenderness and spasm. He exhibits no bony tenderness.       Back:  Palpation of the left parascapular muscle completely reproduced his neck, arm, and chest pain.  Neurological: He is alert and oriented to person, place, and time.  Skin: Skin is warm and dry. No rash noted. He is not diaphoretic. No erythema.  Psychiatric: He has a normal mood and affect.  Vitals reviewed.   ED Results and Treatments Labs (all labs ordered are listed, but only abnormal results are displayed) Labs Reviewed  BASIC METABOLIC PANEL  CBC  I-STAT TROPONIN, ED                                                                                                                         EKG  EKG Interpretation  Date/Time:  Friday May 16 2017 10:39:10 EST Ventricular Rate:  85 PR Interval:  154 QRS Duration: 92 QT Interval:  360 QTC Calculation: 428 R Axis:   81 Text Interpretation:  Normal sinus rhythm with sinus arrhythmia Normal ECG NO STEMI No old tracing to compare Confirmed by Addison Lank [phone]) on 05/16/2017 2:25:41 PM      Radiology Dg Chest 2 View  Result Date: 05/16/2017 CLINICAL DATA:  Chest pain EXAM: CHEST  2 VIEW COMPARISON:  None. FINDINGS: Lungs are clear. Heart size and pulmonary vascularity are normal. No adenopathy. No pneumothorax. No bone lesions. IMPRESSION: No edema or  consolidation. Electronically Signed   By: Lowella Grip III M.D.   On: 05/16/2017 11:06   Pertinent labs & imaging results that were available during my care of the patient were reviewed  by me and considered in my medical decision making (see chart for details).  Medications Ordered in ED Medications - No data to display                                                                                                                                  Procedures Procedures  (including critical care time)  Medical Decision Making / ED Course I have reviewed the nursing notes for this encounter and the patient's prior records (if available in EHR or on provided paperwork).    Atypical chest pain highly inconsistent with ACS.  Most consistent with muscular strain/spasm of the left parascapular muscles.  EKG without acute ischemic changes or evidence of pericarditis.  Labs drawn at triage revealed negative troponin, within normal limits CBC and BMP. Chest x-ray without evidence suggestive of pneumonia, pneumothorax, pneumomediastinum.  No abnormal contour of the mediastinum to suggest dissection. No evidence of acute injuries.  Presentation not classic for aortic dissection or esophageal perforation.  Low pretest probability for pulmonary embolism and patient is PERC negative.  Do not feel that additional cardiac enzymes or further workup are necessary as his symptoms are completely reproduced with palpation of the spastic parascapular muscles.  Recommended supportive, symptomatic, over-the-counter remedies.  The patient is safe for discharge with strict return precautions.   Final Clinical Impression(s) / ED Diagnoses Final diagnoses:  Chest wall pain  Muscle strain  Muscle spasm of left shoulder   Disposition: Discharge  Condition: Good  I have discussed the results, Dx and Tx plan with the patient who expressed understanding and agree(s) with the plan. Discharge instructions discussed at great length. The patient was given strict return precautions who verbalized understanding of the instructions. No further questions at time of discharge.    ED Discharge Orders     None       Follow Up: Eulas Post, MD [place] [id number] [phone]  Schedule an appointment as soon as possible for a visit  As needed      This chart was dictated using voice recognition software.  Despite best efforts to proofread,  errors can occur which can change the documentation meaning.   Fatima Blank, MD 05/16/17 1438

## 2018-06-11 ENCOUNTER — Emergency Department (HOSPITAL_COMMUNITY)
Admission: EM | Admit: 2018-06-11 | Discharge: 2018-06-12 | Disposition: A | Discharge status: Home / Self Care | Payer: 59 | Attending: Emergency Medicine | Admitting: Emergency Medicine

## 2018-06-11 ENCOUNTER — Encounter (HOSPITAL_COMMUNITY): Payer: Self-pay

## 2018-06-11 ENCOUNTER — Emergency Department (HOSPITAL_COMMUNITY): Payer: 59

## 2018-06-11 ENCOUNTER — Other Ambulatory Visit: Payer: Self-pay

## 2018-06-11 DIAGNOSIS — Z7982 Long term (current) use of aspirin: Secondary | ICD-10-CM | POA: Diagnosis not present

## 2018-06-11 DIAGNOSIS — Z79899 Other long term (current) drug therapy: Secondary | ICD-10-CM | POA: Diagnosis not present

## 2018-06-11 DIAGNOSIS — N10 Acute tubulo-interstitial nephritis: Secondary | ICD-10-CM | POA: Insufficient documentation

## 2018-06-11 DIAGNOSIS — R109 Unspecified abdominal pain: Secondary | ICD-10-CM | POA: Diagnosis present

## 2018-06-11 DIAGNOSIS — N12 Tubulo-interstitial nephritis, not specified as acute or chronic: Secondary | ICD-10-CM

## 2018-06-11 LAB — CBC
HEMATOCRIT: 46.4 % (ref 39.0–52.0)
HEMOGLOBIN: 15.1 g/dL (ref 13.0–17.0)
MCH: 28.4 pg (ref 26.0–34.0)
MCHC: 32.5 g/dL (ref 30.0–36.0)
MCV: 87.2 fL (ref 80.0–100.0)
Platelets: 342 10*3/uL (ref 150–400)
RBC: 5.32 MIL/uL (ref 4.22–5.81)
RDW: 13.1 % (ref 11.5–15.5)
WBC: 12.6 10*3/uL — ABNORMAL HIGH (ref 4.0–10.5)
nRBC: 0 % (ref 0.0–0.2)

## 2018-06-11 LAB — URINALYSIS, ROUTINE W REFLEX MICROSCOPIC
Bacteria, UA: NONE SEEN
Bilirubin Urine: NEGATIVE
GLUCOSE, UA: NEGATIVE mg/dL
HGB URINE DIPSTICK: NEGATIVE
Ketones, ur: 80 mg/dL — AB
LEUKOCYTES UA: NEGATIVE
NITRITE: NEGATIVE
PROTEIN: 30 mg/dL — AB
Specific Gravity, Urine: 1.031 — ABNORMAL HIGH (ref 1.005–1.030)
pH: 6 (ref 5.0–8.0)

## 2018-06-11 LAB — BASIC METABOLIC PANEL
ANION GAP: 15 (ref 5–15)
BUN: 10 mg/dL (ref 6–20)
CO2: 23 mmol/L (ref 22–32)
Calcium: 9.5 mg/dL (ref 8.9–10.3)
Chloride: 100 mmol/L (ref 98–111)
Creatinine, Ser: 1 mg/dL (ref 0.61–1.24)
GFR calc Af Amer: >60 mL/min (ref >60)
GLUCOSE: 83 mg/dL (ref 70–99)
Potassium: 3.8 mmol/L (ref 3.5–5.1)
Sodium: 138 mmol/L (ref 135–145)

## 2018-06-11 MED ORDER — HYDROCODONE-ACETAMINOPHEN 5-325 MG PO TABS
1.0000 | ORAL_TABLET | Freq: Once | ORAL | Status: AC
  Administered 2018-06-11: 1 via ORAL
  Filled 2018-06-11: qty 1

## 2018-06-11 NOTE — ED Triage Notes (Signed)
Pt here with right sided flank pain and today began to have bloody urination.  Hx of kidney stones and feeling the same way today.  A&Ox4.

## 2018-06-11 NOTE — ED Provider Notes (Signed)
[facility] EMERGENCY DEPARTMENT Provider Note   CSN: [id number] Arrival date & time: 06/11/18  2028     History   Chief Complaint Chief Complaint  Patient presents with  . Flank Pain    HPI Mark Hopkins is a 32 y.o. male.  HPI  32 year old male comes in with chief complaint of flank pain. Patient has history of UTI and also renal stones.  He reports that he started having pain in his right flank area 2 days ago.  The pain has been persistent and he has started having dysuria and blood in the urine.  Patient is also admitting to having chills and nausea without emesis.  Patient denies any penile discharge, STD risk factors or history of STD.  Past Medical History:  Diagnosis Date  . Chronic back pain   . Depression   . Headache(784.0)   . Migraine   . Urinary tract infection     Patient Active Problem List   Diagnosis Date Noted  . Migraine 10/23/2011  . History of depression 10/23/2011    History reviewed. No pertinent surgical history.      Home Medications    Prior to Admission medications   Medication Sig Start Date End Date Taking? Authorizing Provider  aspirin-acetaminophen-caffeine (EXCEDRIN MIGRAINE) [phone] MG tablet Take 1 tablet every 6 (six) hours as needed by mouth for headache.    [provider]  cephALEXin (KEFLEX) 500 MG capsule Take 1 capsule (500 mg total) by mouth 3 (three) times daily. 06/12/18   Varney Biles, MD  HYDROcodone-acetaminophen (NORCO/VICODIN) 5-325 MG tablet Take 1 tablet by mouth every 6 (six) hours as needed for moderate pain.    [provider]  ibuprofen (ADVIL,MOTRIN) 400 MG tablet Take 400 mg by mouth every 6 (six) hours as needed for mild pain.    [provider]  Multiple Vitamin (MULTIVITAMIN WITH MINERALS) TABS tablet Take 1 tablet daily by mouth.    [provider]  ondansetron (ZOFRAN ODT) 8 MG disintegrating tablet Take 1 tablet (8 mg total) by mouth  every 8 (eight) hours as needed for nausea. 06/12/18   Varney Biles, MD    Family History Family History  Problem Relation Age of Onset  . Alcohol abuse Mother   . Mental illness Mother   . Heart disease Mother        palpitations  . Alcohol abuse Father   . Heart disease Father 83       CAD  . Alcohol abuse Maternal Grandmother   . Alcohol abuse Maternal Grandfather   . Alcohol abuse Paternal Grandmother   . Alcohol abuse Paternal Grandfather     Social History Social History   Tobacco Use  . Smoking status: Never Smoker  . Smokeless tobacco: Never Used  Substance Use Topics  . Alcohol use: Yes    Comment: socially  . Drug use: No     Allergies   Patient has no known allergies.   Review of Systems Review of Systems  Constitutional: Positive for activity change and chills.  Gastrointestinal: Positive for nausea.  Genitourinary: Positive for dysuria.  Allergic/Immunologic: Negative for immunocompromised state.  Hematological: Does not bruise/bleed easily.  All other systems reviewed and are negative.    Physical Exam Updated Vital Signs BP (!) 138/101 (BP Location: Left Arm)   Pulse 99   Temp 98.2 F (36.8 C) (Oral)   Resp 16   Ht 5\' 11"  (1.803 m)   Wt 77.1 kg  SpO2 96%   BMI 23.71 kg/m   Physical Exam  Constitutional: He is oriented to person, place, and time. He appears well-developed.  HENT:  Head: Atraumatic.  Neck: Neck supple.  Cardiovascular: Normal rate.  Pulmonary/Chest: Effort normal.  Abdominal: Soft. There is no tenderness.  Neurological: He is alert and oriented to person, place, and time.  Skin: Skin is warm.  Nursing note and vitals reviewed.    ED Treatments / Results  Labs (all labs ordered are listed, but only abnormal results are displayed) Labs Reviewed  URINALYSIS, ROUTINE W REFLEX MICROSCOPIC - Abnormal; Notable for the following components:      Result Value   Color, Urine AMBER (*)    Specific Gravity, Urine  1.031 (*)    Ketones, ur 80 (*)    Protein, ur 30 (*)    All other components within normal limits  CBC - Abnormal; Notable for the following components:   WBC 12.6 (*)    All other components within normal limits  BASIC METABOLIC PANEL    EKG None  Radiology US Renal  Result Date: 06/11/2018 CLINICAL DATA:  Flank pain, hematuria EXAM: RENAL / URINARY TRACT ULTRASOUND COMPLETE COMPARISON:  08/02/2015 FINDINGS: Right Kidney: Renal measurements: 10.9 x 4.7 x 4.2 cm = volume: 113 mL . Echogenicity within normal limits. No mass or hydronephrosis visualized. Left Kidney: Renal measurements: 11.4 x 5.9 x 4.3 cm = volume: 156 mL. Echogenicity within normal limits. No mass or hydronephrosis visualized. Bladder: Appears normal for degree of bladder distention. IMPRESSION: No acute findings.  No hydronephrosis. Electronically Signed   By: Rolm Baptise M.D.   On: 06/11/2018 23:14    Procedures Procedures (including critical care time)  Medications Ordered in ED Medications  cephALEXin (KEFLEX) capsule 500 mg (has no administration in time range)  HYDROcodone-acetaminophen (NORCO/VICODIN) 5-325 MG per tablet 1 tablet (1 tablet Oral Given 06/11/18 2312)     Initial Impression / Assessment and Plan / ED Course  I have reviewed the triage vital signs and the nursing notes.  Pertinent labs & imaging results that were available during my care of the patient were reviewed by me and considered in my medical decision making (see chart for details).  Clinical Course as of Jun 12 24  Fri Jun 12, 2018  0024 Results from the ER workup discussed with the patient face to face and all questions answered to the best of my ability.  Repeat abdominal exam is unchanged. We will treat as pyelonephritis.  Strict ER return precautions have been discussed.  US Renal [AN]    Clinical Course User Index [AN] Varney Biles, MD    32 year old male comes in with chief complaint of right-sided flank pain and  dysuria plus hematuria.  He has history of kidney stones and UTI. We will get ultrasound renal, which if negative we will treat patient as if he has pyelonephritis. UA shows ketonuria -otherwise it is reassuring. Patient appears reliable and it does not appear that he has history of STDs.  Final Clinical Impressions(s) / ED Diagnoses   Final diagnoses:  Pyelonephritis    ED Discharge Orders         Ordered    cephALEXin (KEFLEX) 500 MG capsule  3 times daily     06/12/18 0023    ondansetron (ZOFRAN ODT) 8 MG disintegrating tablet  Every 8 hours PRN     06/12/18 0023           Varney Biles, MD 06/12/18 0025

## 2018-06-12 MED ORDER — ONDANSETRON 8 MG PO TBDP: 8.0000 mg | ORAL_TABLET | Freq: Three times a day (TID) | ORAL | 0 refills | Status: AC | PRN

## 2018-06-12 MED ORDER — CEPHALEXIN 500 MG PO CAPS: 500.0000 mg | ORAL_CAPSULE | Freq: Three times a day (TID) | ORAL | 0 refills | Status: DC

## 2018-06-12 MED ORDER — CEPHALEXIN 250 MG PO CAPS
500.0000 mg | ORAL_CAPSULE | Freq: Once | ORAL | Status: AC
  Administered 2018-06-12: 500 mg via ORAL
  Filled 2018-06-12: qty 2

## 2018-06-12 MED ORDER — ONDANSETRON 8 MG PO TBDP: 8.0000 mg | ORAL_TABLET | Freq: Three times a day (TID) | ORAL | 0 refills | Status: DC | PRN

## 2018-06-12 NOTE — Discharge Instructions (Signed)
Likely signed the ER for burning with urination along with blood in the urine.  I will results show that you have likely a kidney infection.  Ultrasound does not show any kidney stones.  Please take the antibiotics as prescribed and complete the entire course. Please return to the ER if your symptoms worsen; you have increased pain, fevers, chills, inability to keep any medications down, confusion.

## 2018-06-12 NOTE — ED Notes (Signed)
Patient verbalizes understanding of medications and discharge instructions. No further questions at this time. VSS and patient ambulatory at discharge.   

## 2019-02-23 ENCOUNTER — Emergency Department (HOSPITAL_COMMUNITY): Payer: 59

## 2019-02-23 ENCOUNTER — Encounter (HOSPITAL_COMMUNITY): Payer: Self-pay | Admitting: Emergency Medicine

## 2019-02-23 ENCOUNTER — Emergency Department (HOSPITAL_COMMUNITY)
Admission: EM | Admit: 2019-02-23 | Discharge: 2019-02-23 | Disposition: A | Discharge status: Home / Self Care | Payer: 59 | Attending: Emergency Medicine | Admitting: Emergency Medicine

## 2019-02-23 ENCOUNTER — Other Ambulatory Visit: Payer: Self-pay

## 2019-02-23 DIAGNOSIS — K922 Gastrointestinal hemorrhage, unspecified: Secondary | ICD-10-CM | POA: Diagnosis not present

## 2019-02-23 DIAGNOSIS — Z79899 Other long term (current) drug therapy: Secondary | ICD-10-CM | POA: Diagnosis not present

## 2019-02-23 DIAGNOSIS — K625 Hemorrhage of anus and rectum: Secondary | ICD-10-CM | POA: Diagnosis present

## 2019-02-23 LAB — COMPREHENSIVE METABOLIC PANEL
ALT: 17 U/L (ref 0–44)
AST: 19 U/L (ref 15–41)
Albumin: 3.8 g/dL (ref 3.5–5.0)
Alkaline Phosphatase: 58 U/L (ref 38–126)
Anion gap: 9 (ref 5–15)
BUN: 10 mg/dL (ref 6–20)
CO2: 25 mmol/L (ref 22–32)
Calcium: 9.2 mg/dL (ref 8.9–10.3)
Chloride: 105 mmol/L (ref 98–111)
Creatinine, Ser: 0.96 mg/dL (ref 0.61–1.24)
GFR calc Af Amer: >60 mL/min (ref >60)
GFR calc non Af Amer: >60 mL/min (ref >60)
Glucose, Bld: 96 mg/dL (ref 70–99)
Potassium: 4.3 mmol/L (ref 3.5–5.1)
Sodium: 139 mmol/L (ref 135–145)
Total Bilirubin: 0.2 mg/dL — ABNORMAL LOW (ref 0.3–1.2)
Total Protein: 7.2 g/dL (ref 6.5–8.1)

## 2019-02-23 LAB — CBC
HCT: 42 % (ref 39.0–52.0)
Hemoglobin: 13.4 g/dL (ref 13.0–17.0)
MCH: 28.3 pg (ref 26.0–34.0)
MCHC: 31.9 g/dL (ref 30.0–36.0)
MCV: 88.8 fL (ref 80.0–100.0)
Platelets: 366 10*3/uL (ref 150–400)
RBC: 4.73 MIL/uL (ref 4.22–5.81)
RDW: 13 % (ref 11.5–15.5)
WBC: 7.2 10*3/uL (ref 4.0–10.5)
nRBC: 0 % (ref 0.0–0.2)

## 2019-02-23 LAB — TYPE AND SCREEN
ABO/RH(D): A POS
Antibody Screen: NEGATIVE

## 2019-02-23 LAB — POC OCCULT BLOOD, ED
Fecal Occult Bld: POSITIVE — AB
Fecal Occult Bld: POSITIVE — AB

## 2019-02-23 LAB — ABO/RH: ABO/RH(D): A POS

## 2019-02-23 MED ORDER — IOHEXOL 300 MG/ML  SOLN
100.0000 mL | Freq: Once | INTRAMUSCULAR | Status: AC | PRN
  Administered 2019-02-23: 16:00:00 100 mL via INTRAVENOUS

## 2019-02-23 NOTE — ED Notes (Signed)
Patient verbalizes understanding of discharge instructions. Opportunity for questioning and answers were provided. Armband removed by staff, pt discharged from ED ambulatory.   

## 2019-02-23 NOTE — ED Provider Notes (Signed)
[facility] EMERGENCY DEPARTMENT Provider Note   CSN: [id number] Arrival date & time: 02/23/19  7425     History   Chief Complaint Chief Complaint  Patient presents with   GI Bleeding    HPI Mark Hopkins is a 33 y.o. male.     HPI   33 year old male presents today with complaints of rectal bleeding.  Patient notes that in June he had an episode of bright red blood per rectum.  He notes this went away and had no subsequent bleeding until yesterday.  He notes an episode of red blood per rectum, again he had 2 episodes today with no associated bowel movement.  He notes he feels as if the blood is building up in his rectum.  He denies any associated abdominal pain.  He notes he has been on a diet recently lost 20 pounds, he notes he does have sweats at night.  He notes his father has a history of colon cancer at the age of 50.  Patient denies any diagnosed history of hemorrhoids, he does note a history of anal fissures.  He has no pain with bowel movements.  He does not regularly drink alcohol or use NSAIDs or aspirin.  Past Medical History:  Diagnosis Date   Chronic back pain    Depression    Headache(784.0)    Migraine    Urinary tract infection     Patient Active Problem List   Diagnosis Date Noted   Migraine 10/23/2011   History of depression 10/23/2011    History reviewed. No pertinent surgical history.      Home Medications    Prior to Admission medications   Medication Sig Start Date End Date Taking? Authorizing Provider  aspirin-acetaminophen-caffeine (EXCEDRIN MIGRAINE) [phone] MG tablet Take 1 tablet every 6 (six) hours as needed by mouth for headache.    [provider]  cephALEXin (KEFLEX) 500 MG capsule Take 1 capsule (500 mg total) by mouth 3 (three) times daily. 06/12/18   Varney Biles, MD  HYDROcodone-acetaminophen (NORCO/VICODIN) 5-325 MG tablet Take 1 tablet by mouth every 6 (six) hours as needed for  moderate pain.    [provider]  ibuprofen (ADVIL,MOTRIN) 400 MG tablet Take 400 mg by mouth every 6 (six) hours as needed for mild pain.    [provider]  Multiple Vitamin (MULTIVITAMIN WITH MINERALS) TABS tablet Take 1 tablet daily by mouth.    [provider]  ondansetron (ZOFRAN ODT) 8 MG disintegrating tablet Take 1 tablet (8 mg total) by mouth every 8 (eight) hours as needed for nausea. 06/12/18   Varney Biles, MD    Family History Family History  Problem Relation Age of Onset   Alcohol abuse Mother    Mental illness Mother    Heart disease Mother        palpitations   Alcohol abuse Father    Heart disease Father 57       CAD   Alcohol abuse Maternal Grandmother    Alcohol abuse Maternal Grandfather    Alcohol abuse Paternal Grandmother    Alcohol abuse Paternal Grandfather     Social History Social History   Tobacco Use   Smoking status: Never Smoker   Smokeless tobacco: Never Used  Substance Use Topics   Alcohol use: Yes    Comment: socially   Drug use: No     Allergies   Patient has no known allergies.   Review of Systems Review of Systems  All  other systems reviewed and are negative.    Physical Exam Updated Vital Signs BP (!) 140/95 (BP Location: Right Arm)    Pulse 75    Temp 98.1 F (36.7 C) (Oral)    Resp 20    Ht 6' (1.829 m)    Wt 81.6 kg    SpO2 100%    BMI 24.41 kg/m   Physical Exam Vitals signs and nursing note reviewed.  Constitutional:      Appearance: He is well-developed.  HENT:     Head: Normocephalic and atraumatic.  Eyes:     General: No scleral icterus.       Right eye: No discharge.        Left eye: No discharge.     Conjunctiva/sclera: Conjunctivae normal.     Pupils: Pupils are equal, round, and reactive to light.  Neck:     Musculoskeletal: Normal range of motion.     Vascular: No JVD.     Trachea: No tracheal deviation.  Pulmonary:     Effort: Pulmonary effort is normal.       Breath sounds: No stridor.  Genitourinary:    Comments: External exam with no external hemorrhoids, internal exam with no masses, small amount of red blood on exam-no active bleeding Neurological:     Mental Status: He is alert and oriented to person, place, and time.     Coordination: Coordination normal.  Psychiatric:        Behavior: Behavior normal.        Thought Content: Thought content normal.        Judgment: Judgment normal.      ED Treatments / Results  Labs (all labs ordered are listed, but only abnormal results are displayed) Labs Reviewed  COMPREHENSIVE METABOLIC PANEL - Abnormal; Notable for the following components:      Result Value   Total Bilirubin 0.2 (*)    All other components within normal limits  CBC  POC OCCULT BLOOD, ED  TYPE AND SCREEN  ABO/RH    EKG None  Radiology No results found.  Procedures Procedures (including critical care time)  Medications Ordered in ED Medications - No data to display   Initial Impression / Assessment and Plan / ED Course  I have reviewed the triage vital signs and the nursing notes.  Pertinent labs & imaging results that were available during my care of the patient were reviewed by me and considered in my medical decision making (see chart for details).          Assessment/Plan: 33 year old male presents today with rectal bleeding today.  I do have low suspicion for significant bleed at this time but given patient's complaints and description CT abdomen pelvis will be ordered.  He is stable in no acute distress.  Patient care signed to oncoming provider pending CT and disposition    Final Clinical Impressions(s) / ED Diagnoses   Final diagnoses:  Lower GI bleed    ED Discharge Orders    None       Francee Gentile 02/24/19 1321    Carmin Muskrat, MD 02/25/19 [phone]

## 2019-02-23 NOTE — ED Provider Notes (Signed)
  Physical Exam  BP (!) 140/95 (BP Location: Right Arm)   Pulse 75   Temp 98.1 F (36.7 C) (Oral)   Resp 20   Ht 6' (1.829 m)   Wt 81.6 kg   SpO2 100%   BMI 24.41 kg/m   Physical Exam  ED Course/Procedures     Procedures  MDM  Patient CARE received from Lenn Sink, PA at shift change, please see his note for full HPI.  Briefly, patient with 2 episodes of bloody dark stool, does not have any abdominal pain with this complaints.  Father does have a history of colon cancer at the age of 60.  He denies being diagnosed for hemorrhoids in the past.  Hemoccult was performed in the ED which was positive.  Hemoglobin is within normal limits.  Patient is pending CT abdomen to further evaluate the bleeding. CT abdomen showed:  1. Multiple too small to be actually characterized hypoattenuated  lesions throughout the liver.  2. Question soft tissue thickening versus formed stool matter at the  hepatic flexure of the colon. Please correlate to colonoscopy.  3. Two soft tissue nodules in the left upper quadrant of the  abdomen, measuring 7 and 6 mm. In the absence of malignancy these  may represent splenules.       These results were discussed at length with patient, he currently does not have any GI resources, will provide him with a GI follow-up to further evaluate CT scan along with likely obtain colonoscopy.  Patient is agreeable with this plan, his vital signs are within normal limits, no signs of anemia, no tachycardia.  We will have him follow-up with GI as needed.  Patient stable for discharge.  Return precautions discussed at length.   Portions of this note were generated with Lobby[employer]. Dictation errors may occur despite best attempts at proofreading.      Janeece Fitting, PA-C 02/23/19 Grady, Adam, DO 02/24/19 0003

## 2019-02-23 NOTE — Discharge Instructions (Addendum)
We discussed the results of your CT imaging, I recommend gastroenterology follow-up.  The number total of our GI is attached to your chart.  Please schedule an appointment for further evaluation of your rectal bleeding.  If you experience any dizziness, worsening symptoms please return to the emergency department.

## 2019-02-23 NOTE — ED Triage Notes (Signed)
Pt reports he had a dark bloody stool. Pt reports this is the third time the past 2 months.

## 2019-02-23 NOTE — ED Notes (Signed)
ED Provider at bedside. 

## 2019-02-24 ENCOUNTER — Encounter: Payer: Self-pay | Admitting: Nurse Practitioner

## 2019-03-12 ENCOUNTER — Ambulatory Visit (INDEPENDENT_AMBULATORY_CARE_PROVIDER_SITE_OTHER): Payer: 59 | Admitting: Gastroenterology

## 2019-03-12 ENCOUNTER — Encounter: Payer: Self-pay | Admitting: Internal Medicine

## 2019-03-12 ENCOUNTER — Other Ambulatory Visit: Payer: Self-pay

## 2019-03-12 ENCOUNTER — Encounter: Payer: Self-pay | Admitting: Gastroenterology

## 2019-03-12 VITALS — BP 166/98 | HR 98 | Temp 98.2°F | Ht 72.0 in | Wt 188.5 lb

## 2019-03-12 DIAGNOSIS — K625 Hemorrhage of anus and rectum: Secondary | ICD-10-CM | POA: Insufficient documentation

## 2019-03-12 DIAGNOSIS — R1084 Generalized abdominal pain: Secondary | ICD-10-CM | POA: Diagnosis not present

## 2019-03-12 DIAGNOSIS — K769 Liver disease, unspecified: Secondary | ICD-10-CM | POA: Diagnosis not present

## 2019-03-12 DIAGNOSIS — R197 Diarrhea, unspecified: Secondary | ICD-10-CM

## 2019-03-12 DIAGNOSIS — R933 Abnormal findings on diagnostic imaging of other parts of digestive tract: Secondary | ICD-10-CM

## 2019-03-12 MED ORDER — NA SULFATE-K SULFATE-MG SULF 17.5-3.13-1.6 GM/177ML PO SOLN: ORAL | 0 refills | Status: DC

## 2019-03-12 MED ORDER — DICYCLOMINE HCL 10 MG PO CAPS: 10.0000 mg | ORAL_CAPSULE | Freq: Four times a day (QID) | ORAL | 1 refills | Status: DC | PRN

## 2019-03-12 NOTE — Patient Instructions (Addendum)
If you are age 31 or older, your body mass index should be between 23-30. Your Body mass index is 25.57 kg/m. If this is out of the aforementioned range listed, please consider follow up with your Primary Care Provider.  If you are age 62 or younger, your body mass index should be between 19-25. Your Body mass index is 25.57 kg/m. If this is out of the aformentioned range listed, please consider follow up with your Primary Care Provider.   You have been scheduled for a colonoscopy. Please follow written instructions given to you at your visit today.  Please pick up your prep supplies at the pharmacy within the next 1-3 days. If you use inhalers (even only as needed), please bring them with you on the day of your procedure. Your physician has requested that you go to www.startemmi.com and enter the access code given to you at your visit today. This web site gives a general overview about your procedure. However, you should still follow specific instructions given to you by our office regarding your preparation for the procedure.  We have sent the following medications to your pharmacy for you to pick up at your convenience: Suprep Bentyl 10 mg  Thank you for choosing me and [facility].   Alonza Bogus, PA-C

## 2019-03-12 NOTE — Progress Notes (Signed)
03/12/2019 Mark Hopkins [id number] 1986-02-15   HISTORY OF PRESENT ILLNESS: This is a pleasant 33 year old male who is new to our office.  He tells me that in June of this year he had an episode of rectal bleeding.  That resolved, but then he started having some issues with diarrhea.  Then on August 18 he had another episode of rectal bleeding that he describes as a large amount of dark red-colored blood in the toilet bowl.  This prompted an ER evaluation.  He had a CT scan of the abdomen pelvis with contrast performed that showed multiple too small to actually characterize hypoattenuated lesions within the liver and question soft tissue thickening versus formed stool matter at the hepatic flexure of the colon.  He also had 2 soft tissue nodules in the left upper quadrant of the abdomen measuring 7 and 6 mm that could represent splenules.  His CBC and CMP were normal at that time, but he was Hemoccult positive.  Was told to follow-up here.  He has continued to have rectal bleeding although it has tapered off and he has not seen any most recently.  He does continue to have diarrhea, however, nothing but watery stools.  Complains of diffuse abdominal cramping.  He reports a family history of colon cancer in his paternal grandfather.  Says that there has been some cancers on his mother side of the family, but unsure what kind.  He is unaware of any other type of GI issues including IBD, etc.  He has lost about 25 pounds although he says that he has been trying to eat healthier as well.  Prior to June he was not really having any notable GI complaints.  Said he would have occasional diarrhea and abdominal pain, but nothing that he would have ever seeked medical to attention for.   Past Medical History:  Diagnosis Date  . Abdominal pain   . Change in bowel habits   . Chronic back pain   . Depression   . Fatigue   . Headache(784.0)   . Migraine   . Muscle pain   . Nausea & vomiting   . Night  sweats   . Rectal pain   . Urinary tract infection    History reviewed. No pertinent surgical history.  reports that he has never smoked. He has never used smokeless tobacco. He reports previous alcohol use. He reports that he does not use drugs. family history includes Alcohol abuse in his father, maternal grandfather, maternal grandmother, mother, paternal grandfather, and paternal grandmother; Colon cancer in an other family member; Heart disease in his mother; Heart disease (age of onset: 47) in his father; Kidney disease in an other family member; Liver disease in an other family member; Mental illness in his mother. No Known Allergies    Outpatient Encounter Medications as of 03/12/2019  Medication Sig  . acetaminophen (TYLENOL) 500 MG tablet Take 500 mg by mouth every 6 (six) hours as needed.  . cephALEXin (KEFLEX) 500 MG capsule Take 1 capsule (500 mg total) by mouth 3 (three) times daily.  Marland Kitchen ibuprofen (ADVIL,MOTRIN) 400 MG tablet Take 400 mg by mouth every 6 (six) hours as needed for mild pain.  Marland Kitchen aspirin-acetaminophen-caffeine (EXCEDRIN MIGRAINE) 250-250-65 MG tablet Take 1 tablet every 6 (six) hours as needed by mouth for headache.  Marland Kitchen HYDROcodone-acetaminophen (NORCO/VICODIN) 5-325 MG tablet Take 1 tablet by mouth every 6 (six) hours as needed for moderate pain.  . Multiple Vitamin (MULTIVITAMIN WITH  MINERALS) TABS tablet Take 1 tablet daily by mouth.  . ondansetron (ZOFRAN ODT) 8 MG disintegrating tablet Take 1 tablet (8 mg total) by mouth every 8 (eight) hours as needed for nausea.   No facility-administered encounter medications on file as of 03/12/2019.      REVIEW OF SYSTEMS  : All other systems reviewed and negative except where noted in the History of Present Illness.   PHYSICAL EXAM: BP (!) 166/98 (BP Location: Left Arm, Patient Position: Sitting, Cuff Size: Normal)   Pulse 98   Temp 98.2 F (36.8 C) (Oral)   Ht 6' (1.829 m)   Wt 188 lb 8 oz (85.5 kg)   BMI 25.57  kg/m  General: Well developed white male in no acute distress Head: Normocephalic and atraumatic Eyes:  Sclerae anicteric, conjunctiva pink. Ears: Normal auditory acuity Lungs: Clear throughout to auscultation; no increased WOB. Heart: Regular rate and rhythm; no M/R/G. Abdomen: Soft, non-distended.  BS present.  Diffuse TTP. Rectal:  Will be done at the time of colonoscopy. Musculoskeletal: Symmetrical with no gross deformities  Skin: No lesions on visible extremities Extremities: No edema  Neurological: Alert oriented x 4, grossly non-focal Psychological:  Alert and cooperative. Normal mood and affect  ASSESSMENT AND PLAN: *33 year old male with complaints of rectal bleeding, diarrhea, abdominal pain and now abnormal CT scan showing change in the colon, particularly hepatic flexure.  We will proceed with colonoscopy with Dr. Henrene Pastor next week.  In the interim he can use Imodium as needed and I am going to give him a prescription for some Bentyl to use for abdominal cramping in order to help with some symptom management. *Liver lesions:  Will make further decisions pending results of colonoscopy.  May need repeat imaging in 6 months or so.  **The risks, benefits, and alternatives to colonoscopy were discussed with the patient and he consents to proceed.   CC:  No ref. provider found

## 2019-03-13 NOTE — Progress Notes (Signed)
Assessment and plan reviewed 

## 2019-03-15 ENCOUNTER — Telehealth: Payer: Self-pay

## 2019-03-15 NOTE — Telephone Encounter (Signed)
Covid-19 screening questions   Do you now or have you had a fever in the last 14 days? NO   Do you have any respiratory symptoms of shortness of breath or cough now or in the last 14 days? NO  Do you have any family members or close contacts with diagnosed or suspected Covid-19 in the past 14 days? NO  Have you been tested for Covid-19 and found to be positive? NO        

## 2019-03-16 ENCOUNTER — Other Ambulatory Visit: Payer: Self-pay

## 2019-03-16 ENCOUNTER — Encounter: Payer: Self-pay | Admitting: Internal Medicine

## 2019-03-16 ENCOUNTER — Ambulatory Visit (AMBULATORY_SURGERY_CENTER): Payer: 59 | Admitting: Internal Medicine

## 2019-03-16 VITALS — BP 113/66 | HR 88 | Temp 98.4°F | Resp 12 | Ht 72.0 in | Wt 188.0 lb

## 2019-03-16 DIAGNOSIS — K625 Hemorrhage of anus and rectum: Secondary | ICD-10-CM

## 2019-03-16 DIAGNOSIS — R933 Abnormal findings on diagnostic imaging of other parts of digestive tract: Secondary | ICD-10-CM

## 2019-03-16 DIAGNOSIS — C183 Malignant neoplasm of hepatic flexure: Secondary | ICD-10-CM

## 2019-03-16 DIAGNOSIS — R1084 Generalized abdominal pain: Secondary | ICD-10-CM

## 2019-03-16 DIAGNOSIS — R197 Diarrhea, unspecified: Secondary | ICD-10-CM

## 2019-03-16 DIAGNOSIS — D123 Benign neoplasm of transverse colon: Secondary | ICD-10-CM

## 2019-03-16 MED ORDER — SODIUM CHLORIDE 0.9 % IV SOLN: 500.0000 mL | Freq: Once | INTRAVENOUS | Status: DC

## 2019-03-16 NOTE — Progress Notes (Signed)
Called to room to assist during endoscopic procedure.  Patient ID and intended procedure confirmed with present staff. Received instructions for my participation in the procedure from the performing physician.  

## 2019-03-16 NOTE — Op Note (Signed)
[place] Patient Name: Vicki Bessey Procedure Date: 03/16/2019 3:34 PM MRN: [id number] Endoscopist: Docia Chuck. Henrene Pastor , MD Age: 33 Referring MD:  Date of Birth: [birth year] Gender: Male Account #: [id number] Procedure:                Colonoscopy with biopsies; with submucosal                            injection (marking tattoo) Indications:              Clinically significant diarrhea of unexplained                            origin, Heme positive stool, Rectal bleeding,                            Abnormal CT of the GI tract, weight loss Medicines:                Monitored Anesthesia Care Procedure:                Pre-Anesthesia Assessment:                           - Prior to the procedure, a History and Physical                            was performed, and patient medications and                            allergies were reviewed. The patient's tolerance of                            previous anesthesia was also reviewed. The risks                            and benefits of the procedure and the sedation                            options and risks were discussed with the patient.                            All questions were answered, and informed consent                            was obtained. Prior Anticoagulants: The patient has                            taken no previous anticoagulant or antiplatelet                            agents. ASA Grade Assessment: I - A normal, healthy                            patient. After reviewing the risks and benefits,  the patient was deemed in satisfactory condition to                            undergo the procedure.                           After obtaining informed consent, the colonoscope                            was passed under direct vision. Throughout the                            procedure, the patient's blood pressure, pulse, and                            oxygen saturations were monitored  continuously. The                            Colonoscope was introduced through the anus and                            advanced to the the cecum, identified by                            appendiceal orifice and ileocecal valve. The                            ileocecal valve, appendiceal orifice, and rectum                            were photographed. The quality of the bowel                            preparation was excellent. The colonoscopy was                            performed without difficulty. The patient tolerated                            the procedure well. The bowel preparation used was                            SUPREP via split dose instruction. Scope In: 3:45:52 PM Scope Out: 3:59:34 PM Scope Withdrawal Time: 0 hours 10 minutes 48 seconds  Total Procedure Duration: 0 hours 13 minutes 42 seconds  Findings:                 A frond-like/villous non-obstructing broad-based                            large mass was found at the hepatic flexure. The                            mass was partially circumferential (involving  one-half of the lumen circumference), soft and                            friable. The mass measured three cm in length. In                            addition, its diameter measured five cm. This was                            biopsied with a cold forceps for histology. Area                            was tattooed with an injection of Spot (carbon                            black) at the level of the mass on both sides of                            the lesion (see images).                           The exam was otherwise without abnormality on                            direct and retroflexion views. Complications:            No immediate complications. Estimated blood loss:                            None. Estimated Blood Loss:     Estimated blood loss: none. 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                            Biopsied. Tattooed.                           - The examination was otherwise normal on direct                            and retroflexion views. Recommendation:           - Repeat colonoscopy in 1 year for surveillance                            (after surgical resection).                           - Patient has a contact number available for                            emergencies. The signs and symptoms of potential                            delayed  complications were discussed with the                            patient. Return to normal activities tomorrow.                            Written discharge instructions were provided to the                            patient.                           - Resume previous diet.                           - Continue present medications.                           - Await pathology results.                           - SCHEDULE surgical evaluation with Dr. Alphonsa Overall [facility] surgery) for right                            hemicolectomy Docia Chuck. Henrene Pastor, MD 03/16/2019 4:14:26 PM This report has been signed electronically.

## 2019-03-16 NOTE — Progress Notes (Signed)
PT taken to PACU. Monitors in place. VSS. Report given to RN. 

## 2019-03-16 NOTE — Patient Instructions (Signed)
Continue present medications. Await pathology results.        YOU HAD AN ENDOSCOPIC PROCEDURE TODAY AT THE [facility]:   Refer to the procedure report that was given to you for any specific questions about what was found during the examination.  If the procedure report does not answer your questions, please call your gastroenterologist to clarify.  If you requested that your care partner not be given the details of your procedure findings, then the procedure report has been included in a sealed envelope for you to review at your convenience later.  YOU SHOULD EXPECT: Some feelings of bloating in the abdomen. Passage of more gas than usual.  Walking can help get rid of the air that was put into your GI tract during the procedure and reduce the bloating. If you had a lower endoscopy (such as a colonoscopy or flexible sigmoidoscopy) you may notice spotting of blood in your stool or on the toilet paper. If you underwent a bowel prep for your procedure, you may not have a normal bowel movement for a few days.  Please Note:  You might notice some irritation and congestion in your nose or some drainage.  This is from the oxygen used during your procedure.  There is no need for concern and it should clear up in a day or so.  SYMPTOMS TO REPORT IMMEDIATELY:   Following lower endoscopy (colonoscopy or flexible sigmoidoscopy):  Excessive amounts of blood in the stool  Significant tenderness or worsening of abdominal pains  Swelling of the abdomen that is new, acute  Fever of 100F or higher    For urgent or emergent issues, a gastroenterologist can be reached at any hour by calling [phone].   DIET:  We do recommend a small meal at first, but then you may proceed to your regular diet.  Drink plenty of fluids but you should avoid alcoholic beverages for 24 hours.  ACTIVITY:  You should plan to take it easy for the rest of today and you should NOT DRIVE or use heavy machinery  until tomorrow (because of the sedation medicines used during the test).    FOLLOW UP: Our staff will call the number listed on your records 48-72 hours following your procedure to check on you and address any questions or concerns that you may have regarding the information given to you following your procedure. If we do not reach you, we will leave a message.  We will attempt to reach you two times.  During this call, we will ask if you have developed any symptoms of COVID 19. If you develop any symptoms (ie: fever, flu-like symptoms, shortness of breath, cough etc.) before then, please call [phone].  If you test positive for Covid 19 in the 2 weeks post procedure, please call and report this information to us.    If any biopsies were taken you will be contacted by phone or by letter within the next 1-3 weeks.  Please call us at [phone] if you have not heard about the biopsies in 3 weeks.    SIGNATURES/CONFIDENTIALITY: You and/or your care partner have signed paperwork which will be entered into your electronic medical record.  These signatures attest to the fact that that the information above on your After Visit Summary has been reviewed and is understood.  Full responsibility of the confidentiality of this discharge information lies with you and/or your care-partner. 

## 2019-03-16 NOTE — Progress Notes (Signed)
Pt's states no medical or surgical changes since previsit or office visit.  Vitals- Courtney Covid- June 

## 2019-03-18 ENCOUNTER — Telehealth: Payer: Self-pay

## 2019-03-18 NOTE — Telephone Encounter (Signed)
  Follow up Call-  Call back number 03/16/2019  Post procedure Call Back phone  # [phone]  Permission to leave phone message Yes  Some recent data might be hidden     Patient questions:  Do you have a fever, pain , or abdominal swelling? No. Pain Score  0 *  Have you tolerated food without any problems? Yes.    Have you been able to return to your normal activities? Yes.    Do you have any questions about your discharge instructions: Diet   No. Medications  No. Follow up visit  No.  Do you have questions or concerns about your Care? No.  Actions: * If pain score is 4 or above: No action needed, pain <4. 1. Have you developed a fever since your procedure? no  2.   Have you had an respiratory symptoms (SOB or cough) since your procedure? no  3.   Have you tested positive for COVID 19 since your procedure no  4.   Have you had any family members/close contacts diagnosed with the COVID 19 since your procedure?  no   If yes to any of these questions please route to Joylene John, RN and Alphonsa Gin, Therapist, sports.

## 2019-03-22 ENCOUNTER — Encounter: Payer: Self-pay | Admitting: Internal Medicine

## 2019-03-22 ENCOUNTER — Other Ambulatory Visit: Payer: Self-pay

## 2019-03-22 DIAGNOSIS — K6389 Other specified diseases of intestine: Secondary | ICD-10-CM

## 2019-03-25 ENCOUNTER — Other Ambulatory Visit: Payer: Self-pay

## 2019-03-25 ENCOUNTER — Ambulatory Visit (HOSPITAL_COMMUNITY)
Admission: RE | Admit: 2019-03-25 | Discharge: 2019-03-25 | Disposition: A | Discharge status: Home / Self Care | Payer: 59 | Source: Ambulatory Visit | Attending: Internal Medicine | Admitting: Internal Medicine

## 2019-03-25 DIAGNOSIS — K6389 Other specified diseases of intestine: Secondary | ICD-10-CM

## 2019-03-25 MED ORDER — SODIUM CHLORIDE (PF) 0.9 % IJ SOLN
INTRAMUSCULAR | Status: AC
  Filled 2019-03-25: qty 50

## 2019-03-25 MED ORDER — IOHEXOL 300 MG/ML  SOLN
75.0000 mL | Freq: Once | INTRAMUSCULAR | Status: AC | PRN
  Administered 2019-03-25: 75 mL via INTRAVENOUS

## 2019-03-26 ENCOUNTER — Telehealth: Payer: Self-pay

## 2019-03-26 NOTE — Telephone Encounter (Signed)
Called patient to introduce myself as GI Nurse Navigator and to provide direct contact number. Patient has surgical consult with Dr. Lucia Gaskins on 10/1.  Will follow as needed.

## 2019-04-01 ENCOUNTER — Other Ambulatory Visit: Payer: 59

## 2019-04-01 DIAGNOSIS — K6389 Other specified diseases of intestine: Secondary | ICD-10-CM

## 2019-04-01 LAB — CEA: CEA: 3.1 ng/mL — ABNORMAL HIGH

## 2019-04-08 ENCOUNTER — Other Ambulatory Visit (HOSPITAL_COMMUNITY): Payer: Self-pay | Admitting: Surgery

## 2019-05-11 NOTE — Patient Instructions (Addendum)
DUE TO COVID-19 ONLY ONE VISITOR IS ALLOWED TO COME WITH YOU AND STAY IN THE WAITING ROOM ONLY DURING PRE OP AND PROCEDURE DAY OF SURGERY. THE 1 VISITOR MAY VISIT WITH YOU AFTER SURGERY IN YOUR PRIVATE ROOM DURING VISITING HOURS ONLY!  YOU NEED TO HAVE A COVID 19 TEST ON 05/17/19  @ 8:50 AM, THIS TEST MUST BE DONE BEFORE SURGERY, COME  [place], [address] [place] , [address].  ([place]) ONCE YOUR COVID TEST IS COMPLETED, PLEASE BEGIN THE QUARANTINE INSTRUCTIONS AS OUTLINED IN YOUR HANDOUT.                Dayle Bagshaw [facility]  05/11/2019   Your procedure is scheduled on: 05-20-19    Report to [facility] Main  Entrance    Report to Admitting at A 5:30 AM     Call this number if you have problems the morning of surgery [phone]    Remember: Clear Liquid Diet the Day of Prep, and follow your prep as outlined by your surgeon.    DRINK 2 PRESURGERY ENSURE DRINKS THE NIGHT BEFORE SURGERY AT 1000 PM AND 1 PRESURGERY DRINK THE DAY OF THE PROCEDURE 3 HOURS PRIOR TO SCHEDULED SURGERY.  NOTHING BY MOUTH EXCEPT CLEAR LIQUIDS UNTIL THREE HOURS PRIOR TO SCHEDULED SURGERY. PLEASE FINISH PRESURGERY ENSURE DRINK PER SURGEON ORDER 3 HOURS PRIOR TO SCHEDULED SURGERY TIME WHICH NEEDS TO BE COMPLETED AT 4:30 AM    CLEAR LIQUID DIET   Foods Allowed                                                                     Foods Excluded  Coffee and tea, regular and decaf                             liquids that you cannot  Plain Jell-O any favor except red or purple                                           see through such as: Fruit ices (not with fruit pulp)                                     milk, soups, orange juice  Iced Popsicles                                    All solid food Carbonated beverages, regular and diet                                    Cranberry, grape and apple juices Sports drinks like Gatorade Lightly seasoned clear broth or consume(fat free) Sugar,  honey syrup  Sample Menu Breakfast  Lunch                                     Supper Cranberry juice                    Beef broth                            Chicken broth Jell-O                                     Grape juice                           Apple juice Coffee or tea                        Jell-O                                      Popsicle                                                Coffee or tea                        Coffee or tea  _____________________________________________________________________       Take these medicines the morning of surgery with A SIP OF WATER: None  BRUSH YOUR TEETH MORNING OF SURGERY AND RINSE YOUR MOUTH OUT, NO CHEWING GUM CANDY OR MINTS.                                 You may not have any metal on your body including hair pins and              piercings     Do not wear jewelry, cologne lotions, powders or deodorant                          Men may shave face and neck.   Do not bring valuables to the hospital. [place].  Contacts, dentures or bridgework may not be worn into surgery.  Leave suitcase in the car. After surgery it may be brought to your room.       Special Instructions: N/A              Please read over the following fact sheets you were given: _____________________________________________________________________             [facility] - Preparing for Surgery Before surgery, you can play an important role.  Because skin is not sterile, your skin needs to be as free of germs as possible.  You can reduce the number of germs on your skin by washing with CHG (chlorahexidine gluconate) soap before surgery.  CHG is an antiseptic cleaner which kills  germs and bonds with the skin to continue killing germs even after washing. Please DO NOT use if you have an allergy to CHG or antibacterial soaps.  If your skin becomes reddened/irritated stop  using the CHG and inform your nurse when you arrive at Short Stay. Do not shave (including legs and underarms) for at least 48 hours prior to the first CHG shower.  You may shave your face/neck. Please follow these instructions carefully:  1.  Shower with CHG Soap the night before surgery and the  morning of Surgery.  2.  If you choose to wash your hair, wash your hair first as usual with your  normal  shampoo.  3.  After you shampoo, rinse your hair and body thoroughly to remove the  shampoo.                           4.  Use CHG as you would any other liquid soap.  You can apply chg directly  to the skin and wash                       Gently with a scrungie or clean washcloth.  5.  Apply the CHG Soap to your body ONLY FROM THE NECK DOWN.   Do not use on face/ open                           Wound or open sores. Avoid contact with eyes, ears mouth and genitals (private parts).                       Wash face,  Genitals (private parts) with your normal soap.             6.  Wash thoroughly, paying special attention to the area where your surgery  will be performed.  7.  Thoroughly rinse your body with warm water from the neck down.  8.  DO NOT shower/wash with your normal soap after using and rinsing off  the CHG Soap.                9.  Pat yourself dry with a clean towel.            10.  Wear clean pajamas.            11.  Place clean sheets on your bed the night of your first shower and do not  sleep with pets. Day of Surgery : Do not apply any lotions/deodorants the morning of surgery.  Please wear clean clothes to the hospital/surgery center.  FAILURE TO FOLLOW THESE INSTRUCTIONS MAY RESULT IN THE CANCELLATION OF YOUR SURGERY PATIENT SIGNATURE_________________________________  NURSE SIGNATURE__________________________________  ________________________________________________________________________

## 2019-05-13 ENCOUNTER — Other Ambulatory Visit: Payer: Self-pay

## 2019-05-13 ENCOUNTER — Encounter (HOSPITAL_COMMUNITY)
Admission: RE | Admit: 2019-05-13 | Discharge: 2019-05-13 | Disposition: A | Discharge status: Home / Self Care | Payer: 59 | Source: Ambulatory Visit | Attending: Surgery | Admitting: Surgery

## 2019-05-13 ENCOUNTER — Encounter (HOSPITAL_COMMUNITY): Payer: Self-pay

## 2019-05-13 DIAGNOSIS — C183 Malignant neoplasm of hepatic flexure: Secondary | ICD-10-CM | POA: Insufficient documentation

## 2019-05-13 DIAGNOSIS — Z01812 Encounter for preprocedural laboratory examination: Secondary | ICD-10-CM | POA: Diagnosis not present

## 2019-05-13 LAB — CBC WITH DIFFERENTIAL/PLATELET
Abs Immature Granulocytes: 0.06 10*3/uL (ref 0.00–0.07)
Basophils Absolute: 0.1 10*3/uL (ref 0.0–0.1)
Basophils Relative: 1 %
Eosinophils Absolute: 0.2 10*3/uL (ref 0.0–0.5)
Eosinophils Relative: 3 %
HCT: 45.5 % (ref 39.0–52.0)
Hemoglobin: 14.2 g/dL (ref 13.0–17.0)
Immature Granulocytes: 1 %
Lymphocytes Relative: 20 %
Lymphs Abs: 1.6 10*3/uL (ref 0.7–4.0)
MCH: 26.7 pg (ref 26.0–34.0)
MCHC: 31.2 g/dL (ref 30.0–36.0)
MCV: 85.7 fL (ref 80.0–100.0)
Monocytes Absolute: 0.7 10*3/uL (ref 0.1–1.0)
Monocytes Relative: 8 %
Neutro Abs: 5.6 10*3/uL (ref 1.7–7.7)
Neutrophils Relative %: 67 %
Platelets: 406 10*3/uL — ABNORMAL HIGH (ref 150–400)
RBC: 5.31 MIL/uL (ref 4.22–5.81)
RDW: 13.5 % (ref 11.5–15.5)
WBC: 8.3 10*3/uL (ref 4.0–10.5)
nRBC: 0 % (ref 0.0–0.2)

## 2019-05-13 LAB — COMPREHENSIVE METABOLIC PANEL
ALT: 19 U/L (ref 0–44)
AST: 17 U/L (ref 15–41)
Albumin: 4.3 g/dL (ref 3.5–5.0)
Alkaline Phosphatase: 66 U/L (ref 38–126)
Anion gap: 7 (ref 5–15)
BUN: 9 mg/dL (ref 6–20)
CO2: 27 mmol/L (ref 22–32)
Calcium: 9.4 mg/dL (ref 8.9–10.3)
Chloride: 105 mmol/L (ref 98–111)
Creatinine, Ser: 0.82 mg/dL (ref 0.61–1.24)
GFR calc Af Amer: >60 mL/min (ref >60)
GFR calc non Af Amer: >60 mL/min (ref >60)
Glucose, Bld: 78 mg/dL (ref 70–99)
Potassium: 4 mmol/L (ref 3.5–5.1)
Sodium: 139 mmol/L (ref 135–145)
Total Bilirubin: 0.7 mg/dL (ref 0.3–1.2)
Total Protein: 7.9 g/dL (ref 6.5–8.1)

## 2019-05-13 LAB — HEMOGLOBIN A1C
Hgb A1c MFr Bld: 5.6 % (ref 4.8–5.6)
Mean Plasma Glucose: 114.02 mg/dL

## 2019-05-17 ENCOUNTER — Other Ambulatory Visit (HOSPITAL_COMMUNITY)
Admission: RE | Admit: 2019-05-17 | Discharge: 2019-05-17 | Disposition: A | Discharge status: Home / Self Care | Payer: 59 | Source: Ambulatory Visit | Attending: Surgery | Admitting: Surgery

## 2019-05-17 DIAGNOSIS — Z20828 Contact with and (suspected) exposure to other viral communicable diseases: Secondary | ICD-10-CM | POA: Insufficient documentation

## 2019-05-17 DIAGNOSIS — Z01812 Encounter for preprocedural laboratory examination: Secondary | ICD-10-CM | POA: Insufficient documentation

## 2019-05-18 LAB — NOVEL CORONAVIRUS, NAA (HOSP ORDER, SEND-OUT TO REF LAB; TAT 18-24 HRS): SARS-CoV-2, NAA: NOT DETECTED

## 2019-05-19 MED ORDER — BUPIVACAINE LIPOSOME 1.3 % IJ SUSP
20.0000 mL | Freq: Once | INTRAMUSCULAR | Status: DC
  Filled 2019-05-19: qty 20

## 2019-05-19 NOTE — H&P (Signed)
Mark Hopkins  Location: [facility] Patient #: [id number] DOB: 20-Apr-1986 Married / Language: English / Race: White Male  History of Present Illness   The patient is a 33 year old male who presents with a complaint of colon cancer.  The PCP is none.  The patient was referred by Dr. Rikki Hopkins  [The Covid-19 virus has disrupted normal medical care in [place] and across the nation. We have sometimes had to alter normal surgical/medical care to limit this epidemic and we have explained these changes to the patient.]  He had some unexplained diarrhea and heme positive stool. This sort of started in June 2020. He had another episode in July. This prompted him to go to the emergency room on 02/23/2019. This prompted an abdominal CT scan - which lead to a referral for a colonoscopy. He has no history of stomach, liver, or pancreas disease. He's had no prior abdominal surgery. He had a colonoscopy by Dr. Henrene Hopkins on 03/16/2019 which showed a mass in his right colon at the hepatic flexure. A biopsy showed adenoca. A CT of his chest (9/172020)and a CT of his abdomen (02/23/2019) on showed negative chest, but gall stones. His abdomen 1) hypo attenuated lesions of the liver, 2) question mass at the splenic flexure, 3) two soft tissue nodules in the LUQ His CEA is 3.1 (04/01/2019).  I reviewed with the patient the findings and need for colon surgery. I discussed both surgical and non surgical options. I discussed the role of laparoscopic and open surgery in colon surgery. I reviewed the risks of surgery, including, but not limited to, infection, bleeding, nerve injury, anastomotic leaks, and possibility of colostomy. I went over the preoperative mechanical and antibiotic bowel prep for colon surgery and provided prescriptions for these. I provided the patient literature on colon surgery. I tried to answer all questions for the patient.  Review of  Systems as stated in this history (HPI) or in the review of systems. Otherwise all other 12 point ROS are negative  Past Medical History: 1. Hepatic flexure colon ca 2. Gallstones 3. Splenules ??  Social History: Married - wife - Mark Hopkins Has 2 children: 56 and 19 yo. His wife is expecting on 04/15/2019 a girl. He already has a boy and a girl. 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   Past Surgical History (Mark Hopkins, Mark Hopkins; 04/08/2019 1:52 PM) No pertinent past surgical history   Diagnostic Studies History (Mark Hopkins, Mark Hopkins; 04/08/2019 1:52 PM) Colonoscopy  within last year  Allergies (Mark Hopkins, [name]; 04/08/2019 1:52 PM) No Known Drug Allergies [04/08/2019]: Allergies Reconciled   Medication History (Mark Hopkins, [name]; 04/08/2019 1:52 PM) Dicyclomine HCl (10MG  Capsule, Oral) Active. Medications Reconciled  Social History (Mark Hopkins, [name]; 04/08/2019 1:52 PM) Alcohol use  Moderate alcohol use. Illicit drug use  Remotely quit drug use. No caffeine use  Tobacco use  Never smoker.  Family History (Mark Hopkins, [name]; 04/08/2019 1:52 PM) Alcohol Abuse  Father. Arthritis  Mother. Colon Cancer  Family Members In General. Depression  Father, Mother. Diabetes Mellitus  Mother. Heart Disease  Father, Mother. Kidney Disease  Mother. Thyroid problems  Mother.  Other Problems (Mark Hopkins, [name]; 04/08/2019 1:52 PM) Anxiety Disorder  Colon Cancer  Gastroesophageal Reflux Disease  Migraine Headache     Review of Systems (Mark A. Brown Mark Hopkins; 04/08/2019 1:52 PM) General Present- Appetite Loss. Not Present- Chills, Fatigue, Fever,  Night Sweats, Weight Gain and Weight Loss. Skin Not Present- Change in Wart/Mole, Dryness, Hives, Jaundice, New Lesions, Non-Healing Wounds, Rash and Ulcer. HEENT Not Present- Earache, Hearing Loss, Hoarseness, Nose Bleed, Oral Ulcers, Ringing in  the Ears, Seasonal Allergies, Sinus Pain, Sore Throat, Visual Disturbances, Wears glasses/contact lenses and Yellow Eyes. Respiratory Not Present- Bloody sputum, Chronic Cough, Difficulty Breathing, Snoring and Wheezing. Breast Not Present- Breast Mass, Breast Pain, Nipple Discharge and Skin Changes. Gastrointestinal Present- Abdominal Pain, Nausea and Vomiting. Not Present- Bloating, Bloody Stool, Change in Bowel Habits, Chronic diarrhea, Constipation, Difficulty Swallowing, Excessive gas, Gets full quickly at meals, Hemorrhoids, Indigestion and Rectal Pain. Male Genitourinary Not Present- Blood in Urine, Change in Urinary Stream, Frequency, Impotence, Nocturia, Painful Urination, Urgency and Urine Leakage. Musculoskeletal Not Present- Back Pain, Joint Pain, Joint Stiffness, Muscle Pain, Muscle Weakness and Swelling of Extremities. Neurological Not Present- Decreased Memory, Fainting, Headaches, Numbness, Seizures, Tingling, Tremor, Trouble walking and Weakness. Psychiatric Present- Anxiety. Not Present- Bipolar, Change in Sleep Pattern, Depression, Fearful and Frequent crying. Endocrine Not Present- Cold Intolerance, Excessive Hunger, Hair Changes, Heat Intolerance, Hot flashes and New Diabetes. Hematology Not Present- Blood Thinners, Easy Bruising, Excessive bleeding, Gland problems, HIV and Persistent Infections.  Vitals (Mark A. Brown Mark Hopkins; 04/08/2019 1:52 PM) 04/08/2019 1:52 PM Weight: 187 lb Height: 72in Body Surface Area: 2.07 m Body Mass Index: 25.36 kg/m  Temp.: 97.20F  Pulse: 68 (Regular)  BP: 118/74 (Sitting, Left Arm, Standard)  Physical Exam  General: WN WM who is alert and generally healthy appearing. HEENT: Normal. Pupils equal.  Neck: Supple. No mass. No thyroid mass. Lymph Nodes: No supraclavicular or cervical nodes.  Lungs: Clear to auscultation and symmetric breath sounds. Heart: RRR. No murmur or rub.  Abdomen: Soft. I feel no mass. No tenderness. No  hernia. Normal bowel sounds. No abdominal scars. Rectal: Not done.  Extremities: Good strength and ROM in upper and lower extremities. he has tattoos on both arms.  Neurologic: Grossly intact to motor and sensory function. Psychiatric: Has normal mood and affect. Behavior is normal.   Assessment & Plan  1.  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 Mechanical and antibiotic bowel prep  2. Laparoscopic right hemicolectomy  2.  Gallstones  Alphonsa Overall, MD, [facility] Surgery Office phone:  [phone]

## 2019-05-19 NOTE — Anesthesia Preprocedure Evaluation (Addendum)
Anesthesia Evaluation  Patient identified by MRN, date of birth, ID band Patient awake    Reviewed: Allergy & Precautions, H&P , NPO status , Patient's Chart, lab work & pertinent test results  Airway Mallampati: II  TM Distance: >3 FB Neck ROM: Full    Dental no notable dental hx. (+) Teeth Intact, Dental Advisory Given   Pulmonary neg pulmonary ROS,    Pulmonary exam normal breath sounds clear to auscultation       Cardiovascular Exercise Tolerance: Good negative cardio ROS Normal cardiovascular exam Rhythm:Regular Rate:Normal     Neuro/Psych  Headaches, PSYCHIATRIC DISORDERS Depression negative neurological ROS  negative psych ROS   GI/Hepatic negative GI ROS, Neg liver ROS,   Endo/Other  negative endocrine ROS  Renal/GU negative Renal ROS  negative genitourinary   Musculoskeletal negative musculoskeletal ROS (+)   Abdominal   Peds negative pediatric ROS (+)  Hematology negative hematology ROS (+)   Anesthesia Other Findings   Reproductive/Obstetrics negative OB ROS                            Anesthesia Physical Anesthesia Plan  ASA: II  Anesthesia Plan: General   Post-op Pain Management:    Induction: Intravenous  PONV Risk Score and Plan: 2 and Ondansetron, Dexamethasone and Treatment may vary due to age or medical condition  Airway Management Planned: Oral ETT  Additional Equipment:   Intra-op Plan:   Post-operative Plan: Extubation in OR  Informed Consent: I have reviewed the patients History and Physical, chart, labs and discussed the procedure including the risks, benefits and alternatives for the proposed anesthesia with the patient or authorized representative who has indicated his/her understanding and acceptance.       Plan Discussed with: CRNA, Anesthesiologist and Surgeon  Anesthesia Plan Comments: (  )        Anesthesia Quick Evaluation

## 2019-05-20 ENCOUNTER — Other Ambulatory Visit: Payer: Self-pay

## 2019-05-20 ENCOUNTER — Encounter (HOSPITAL_COMMUNITY): Payer: Self-pay | Admitting: *Deleted

## 2019-05-20 ENCOUNTER — Encounter (HOSPITAL_COMMUNITY)
Admission: RE | Disposition: A | Discharge status: Home / Self Care | Payer: Self-pay | Source: Home / Self Care | Attending: Surgery

## 2019-05-20 ENCOUNTER — Inpatient Hospital Stay (HOSPITAL_COMMUNITY): Payer: 59 | Admitting: Certified Registered"

## 2019-05-20 ENCOUNTER — Inpatient Hospital Stay (HOSPITAL_COMMUNITY)
Admission: RE | Admit: 2019-05-20 | Discharge: 2019-05-24 | DRG: 331 | Disposition: A | Discharge status: Home / Self Care | Payer: 59 | Attending: Surgery | Admitting: Surgery

## 2019-05-20 DIAGNOSIS — Z20828 Contact with and (suspected) exposure to other viral communicable diseases: Secondary | ICD-10-CM | POA: Diagnosis present

## 2019-05-20 DIAGNOSIS — C183 Malignant neoplasm of hepatic flexure: Secondary | ICD-10-CM | POA: Diagnosis present

## 2019-05-20 DIAGNOSIS — Z888 Allergy status to other drugs, medicaments and biological substances status: Secondary | ICD-10-CM | POA: Diagnosis not present

## 2019-05-20 DIAGNOSIS — K8020 Calculus of gallbladder without cholecystitis without obstruction: Secondary | ICD-10-CM | POA: Diagnosis present

## 2019-05-20 DIAGNOSIS — Z800 Family history of malignant neoplasm of digestive organs: Secondary | ICD-10-CM

## 2019-05-20 HISTORY — PX: LAPAROSCOPIC RIGHT HEMI COLECTOMY: SHX5926

## 2019-05-20 SURGERY — LAPAROSCOPIC RIGHT HEMI COLECTOMY
Anesthesia: General | Site: Abdomen | Laterality: Right

## 2019-05-20 MED ORDER — HEPARIN SODIUM (PORCINE) 5000 UNIT/ML IJ SOLN
INTRAMUSCULAR | Status: AC
  Administered 2019-05-20: 5000 [IU] via SUBCUTANEOUS
  Filled 2019-05-20: qty 1

## 2019-05-20 MED ORDER — MORPHINE SULFATE (PF) 2 MG/ML IV SOLN
1.0000 mg | INTRAVENOUS | Status: DC | PRN
  Administered 2019-05-20 (×3): 2 mg via INTRAVENOUS
  Administered 2019-05-21: 3 mg via INTRAVENOUS
  Administered 2019-05-21: 2 mg via INTRAVENOUS
  Filled 2019-05-20 (×2): qty 1
  Filled 2019-05-20: qty 2
  Filled 2019-05-20 (×2): qty 1
  Filled 2019-05-20: qty 2

## 2019-05-20 MED ORDER — MIDAZOLAM HCL 2 MG/2ML IJ SOLN
INTRAMUSCULAR | Status: DC | PRN
  Administered 2019-05-20: 2 mg via INTRAVENOUS

## 2019-05-20 MED ORDER — FENTANYL CITRATE (PF) 250 MCG/5ML IJ SOLN
INTRAMUSCULAR | Status: DC | PRN
  Administered 2019-05-20 (×3): 50 ug via INTRAVENOUS
  Administered 2019-05-20: 100 ug via INTRAVENOUS
  Administered 2019-05-20: 25 ug via INTRAVENOUS

## 2019-05-20 MED ORDER — ONDANSETRON HCL 4 MG/2ML IJ SOLN
INTRAMUSCULAR | Status: DC | PRN
  Administered 2019-05-20: 4 mg via INTRAVENOUS

## 2019-05-20 MED ORDER — SODIUM CHLORIDE 0.9 % IV SOLN
INTRAVENOUS | Status: AC
  Filled 2019-05-20: qty 2

## 2019-05-20 MED ORDER — MEPERIDINE HCL 50 MG/ML IJ SOLN: 6.2500 mg | INTRAMUSCULAR | Status: DC | PRN

## 2019-05-20 MED ORDER — FENTANYL CITRATE (PF) 100 MCG/2ML IJ SOLN
INTRAMUSCULAR | Status: AC
  Filled 2019-05-20: qty 2

## 2019-05-20 MED ORDER — LACTATED RINGERS IV SOLN
INTRAVENOUS | Status: DC
  Administered 2019-05-20: 06:00:00 via INTRAVENOUS

## 2019-05-20 MED ORDER — KETAMINE HCL 10 MG/ML IJ SOLN
INTRAMUSCULAR | Status: DC | PRN
  Administered 2019-05-20: 20 mg via INTRAVENOUS
  Administered 2019-05-20 (×2): 10 mg via INTRAVENOUS
  Administered 2019-05-20: 20 mg via INTRAVENOUS

## 2019-05-20 MED ORDER — PHENYLEPHRINE 40 MCG/ML (10ML) SYRINGE FOR IV PUSH (FOR BLOOD PRESSURE SUPPORT)
PREFILLED_SYRINGE | INTRAVENOUS | Status: DC | PRN
  Administered 2019-05-20: 80 ug via INTRAVENOUS
  Administered 2019-05-20: 40 ug via INTRAVENOUS
  Administered 2019-05-20 (×2): 80 ug via INTRAVENOUS

## 2019-05-20 MED ORDER — ONDANSETRON HCL 4 MG/2ML IJ SOLN: 4.0000 mg | Freq: Once | INTRAMUSCULAR | Status: DC | PRN

## 2019-05-20 MED ORDER — PROPOFOL 10 MG/ML IV BOLUS
INTRAVENOUS | Status: DC | PRN
  Administered 2019-05-20: 200 mg via INTRAVENOUS

## 2019-05-20 MED ORDER — ALBUMIN HUMAN 5 % IV SOLN
INTRAVENOUS | Status: DC | PRN
  Administered 2019-05-20: 09:00:00 via INTRAVENOUS

## 2019-05-20 MED ORDER — ACETAMINOPHEN 500 MG PO TABS
ORAL_TABLET | ORAL | Status: AC
  Administered 2019-05-20: 1000 mg via ORAL
  Filled 2019-05-20: qty 2

## 2019-05-20 MED ORDER — LIDOCAINE 2% (20 MG/ML) 5 ML SYRINGE
INTRAMUSCULAR | Status: DC | PRN
  Administered 2019-05-20: 100 mg via INTRAVENOUS

## 2019-05-20 MED ORDER — FENTANYL CITRATE (PF) 250 MCG/5ML IJ SOLN
INTRAMUSCULAR | Status: AC
  Filled 2019-05-20: qty 5

## 2019-05-20 MED ORDER — ENSURE SURGERY PO LIQD
237.0000 mL | Freq: Two times a day (BID) | ORAL | Status: DC
  Administered 2019-05-21 – 2019-05-24 (×7): 237 mL via ORAL
  Filled 2019-05-20 (×8): qty 237

## 2019-05-20 MED ORDER — CHLORHEXIDINE GLUCONATE 4 % EX LIQD: 60.0000 mL | Freq: Once | CUTANEOUS | Status: DC

## 2019-05-20 MED ORDER — TRAMADOL HCL 50 MG PO TABS
50.0000 mg | ORAL_TABLET | Freq: Four times a day (QID) | ORAL | Status: DC | PRN
  Administered 2019-05-21 (×2): 50 mg via ORAL
  Filled 2019-05-20 (×3): qty 1

## 2019-05-20 MED ORDER — OXYCODONE HCL 5 MG PO TABS: 5.0000 mg | ORAL_TABLET | Freq: Once | ORAL | Status: DC | PRN

## 2019-05-20 MED ORDER — BUPIVACAINE-EPINEPHRINE 0.25% -1:200000 IJ SOLN
INTRAMUSCULAR | Status: AC
  Filled 2019-05-20: qty 1

## 2019-05-20 MED ORDER — PROPOFOL 10 MG/ML IV BOLUS
INTRAVENOUS | Status: AC
  Filled 2019-05-20: qty 40

## 2019-05-20 MED ORDER — HEPARIN SODIUM (PORCINE) 5000 UNIT/ML IJ SOLN
5000.0000 [IU] | Freq: Once | INTRAMUSCULAR | Status: AC
  Administered 2019-05-20: 06:00:00 5000 [IU] via SUBCUTANEOUS

## 2019-05-20 MED ORDER — DEXAMETHASONE SODIUM PHOSPHATE 10 MG/ML IJ SOLN
INTRAMUSCULAR | Status: DC | PRN
  Administered 2019-05-20: 8 mg via INTRAVENOUS

## 2019-05-20 MED ORDER — ACETAMINOPHEN 325 MG PO TABS: 325.0000 mg | ORAL_TABLET | ORAL | Status: DC | PRN

## 2019-05-20 MED ORDER — BUPIVACAINE LIPOSOME 1.3 % IJ SUSP
INTRAMUSCULAR | Status: DC | PRN
  Administered 2019-05-20: 20 mL

## 2019-05-20 MED ORDER — ACETAMINOPHEN 325 MG PO TABS
650.0000 mg | ORAL_TABLET | Freq: Four times a day (QID) | ORAL | Status: DC
  Administered 2019-05-20 – 2019-05-24 (×15): 650 mg via ORAL
  Filled 2019-05-20 (×15): qty 2

## 2019-05-20 MED ORDER — CHLORHEXIDINE GLUCONATE CLOTH 2 % EX PADS
6.0000 | MEDICATED_PAD | Freq: Every day | CUTANEOUS | Status: DC
  Administered 2019-05-21 – 2019-05-22 (×2): 6 via TOPICAL

## 2019-05-20 MED ORDER — OXYCODONE HCL 5 MG/5ML PO SOLN: 5.0000 mg | Freq: Once | ORAL | Status: DC | PRN

## 2019-05-20 MED ORDER — BUPIVACAINE-EPINEPHRINE 0.25% -1:200000 IJ SOLN
INTRAMUSCULAR | Status: DC | PRN
  Administered 2019-05-20: 30 mL

## 2019-05-20 MED ORDER — KETAMINE HCL 10 MG/ML IJ SOLN
INTRAMUSCULAR | Status: AC
  Filled 2019-05-20: qty 1

## 2019-05-20 MED ORDER — ACETAMINOPHEN 160 MG/5ML PO SOLN: 325.0000 mg | ORAL | Status: DC | PRN

## 2019-05-20 MED ORDER — ROCURONIUM BROMIDE 10 MG/ML (PF) SYRINGE
PREFILLED_SYRINGE | INTRAVENOUS | Status: DC | PRN
  Administered 2019-05-20: 10 mg via INTRAVENOUS
  Administered 2019-05-20: 20 mg via INTRAVENOUS
  Administered 2019-05-20: 5 mg via INTRAVENOUS
  Administered 2019-05-20: 70 mg via INTRAVENOUS
  Administered 2019-05-20 (×2): 20 mg via INTRAVENOUS

## 2019-05-20 MED ORDER — LIDOCAINE 2% (20 MG/ML) 5 ML SYRINGE
INTRAMUSCULAR | Status: AC
  Filled 2019-05-20: qty 5

## 2019-05-20 MED ORDER — ONDANSETRON HCL 4 MG/2ML IJ SOLN
INTRAMUSCULAR | Status: AC
  Filled 2019-05-20: qty 2

## 2019-05-20 MED ORDER — SODIUM CHLORIDE 0.9 % IV SOLN
2.0000 g | INTRAVENOUS | Status: AC
  Administered 2019-05-20: 2 g via INTRAVENOUS

## 2019-05-20 MED ORDER — SODIUM CHLORIDE 0.9 % IR SOLN
Status: DC | PRN
  Administered 2019-05-20: 2000 mL

## 2019-05-20 MED ORDER — ENOXAPARIN SODIUM 40 MG/0.4ML ~~LOC~~ SOLN
40.0000 mg | SUBCUTANEOUS | Status: DC
  Administered 2019-05-21 – 2019-05-23 (×3): 40 mg via SUBCUTANEOUS
  Filled 2019-05-20 (×3): qty 0.4

## 2019-05-20 MED ORDER — ONDANSETRON HCL 4 MG PO TABS: 4.0000 mg | ORAL_TABLET | Freq: Four times a day (QID) | ORAL | Status: DC | PRN

## 2019-05-20 MED ORDER — KCL IN DEXTROSE-NACL 20-5-0.45 MEQ/L-%-% IV SOLN
INTRAVENOUS | Status: DC
  Administered 2019-05-20 – 2019-05-22 (×4): via INTRAVENOUS
  Filled 2019-05-20 (×4): qty 1000

## 2019-05-20 MED ORDER — LIDOCAINE 2% (20 MG/ML) 5 ML SYRINGE
INTRAMUSCULAR | Status: DC | PRN
  Administered 2019-05-20: 1.5 mg/kg/h via INTRAVENOUS

## 2019-05-20 MED ORDER — DEXAMETHASONE SODIUM PHOSPHATE 10 MG/ML IJ SOLN
INTRAMUSCULAR | Status: AC
  Filled 2019-05-20: qty 1

## 2019-05-20 MED ORDER — FENTANYL CITRATE (PF) 100 MCG/2ML IJ SOLN
25.0000 ug | INTRAMUSCULAR | Status: DC | PRN
  Administered 2019-05-20: 25 ug via INTRAVENOUS
  Administered 2019-05-20: 50 ug via INTRAVENOUS

## 2019-05-20 MED ORDER — SUGAMMADEX SODIUM 200 MG/2ML IV SOLN
INTRAVENOUS | Status: DC | PRN
  Administered 2019-05-20: 200 mg via INTRAVENOUS

## 2019-05-20 MED ORDER — ALVIMOPAN 12 MG PO CAPS
12.0000 mg | ORAL_CAPSULE | ORAL | Status: AC
  Administered 2019-05-20: 06:00:00 12 mg via ORAL

## 2019-05-20 MED ORDER — OXYCODONE HCL 5 MG PO TABS
5.0000 mg | ORAL_TABLET | ORAL | Status: DC | PRN
  Administered 2019-05-20 – 2019-05-24 (×15): 10 mg via ORAL
  Filled 2019-05-20 (×15): qty 2

## 2019-05-20 MED ORDER — MIDAZOLAM HCL 2 MG/2ML IJ SOLN
INTRAMUSCULAR | Status: AC
  Filled 2019-05-20: qty 2

## 2019-05-20 MED ORDER — ROCURONIUM BROMIDE 10 MG/ML (PF) SYRINGE
PREFILLED_SYRINGE | INTRAVENOUS | Status: AC
  Filled 2019-05-20: qty 10

## 2019-05-20 MED ORDER — PHENYLEPHRINE 40 MCG/ML (10ML) SYRINGE FOR IV PUSH (FOR BLOOD PRESSURE SUPPORT)
PREFILLED_SYRINGE | INTRAVENOUS | Status: AC
  Filled 2019-05-20: qty 10

## 2019-05-20 MED ORDER — ACETAMINOPHEN 500 MG PO TABS
1000.0000 mg | ORAL_TABLET | ORAL | Status: AC
  Administered 2019-05-20: 06:00:00 1000 mg via ORAL

## 2019-05-20 MED ORDER — SACCHAROMYCES BOULARDII 250 MG PO CAPS
250.0000 mg | ORAL_CAPSULE | Freq: Two times a day (BID) | ORAL | Status: DC
  Administered 2019-05-21 – 2019-05-24 (×7): 250 mg via ORAL
  Filled 2019-05-20 (×7): qty 1

## 2019-05-20 MED ORDER — ONDANSETRON HCL 4 MG/2ML IJ SOLN: 4.0000 mg | Freq: Four times a day (QID) | INTRAMUSCULAR | Status: DC | PRN

## 2019-05-20 MED ORDER — ALVIMOPAN 12 MG PO CAPS
12.0000 mg | ORAL_CAPSULE | Freq: Two times a day (BID) | ORAL | Status: DC
  Administered 2019-05-21 (×2): 12 mg via ORAL
  Filled 2019-05-20 (×3): qty 1

## 2019-05-20 MED ORDER — ALVIMOPAN 12 MG PO CAPS
ORAL_CAPSULE | ORAL | Status: AC
  Administered 2019-05-20: 12 mg via ORAL
  Filled 2019-05-20: qty 1

## 2019-05-20 MED ORDER — LACTATED RINGERS IV SOLN
INTRAVENOUS | Status: DC | PRN
  Administered 2019-05-20: 07:00:00 via INTRAVENOUS

## 2019-05-20 MED ORDER — RINGERS IRRIGATION IR SOLN
Status: DC | PRN
  Administered 2019-05-20: 1000 mL

## 2019-05-20 MED ORDER — LIDOCAINE 2% (20 MG/ML) 5 ML SYRINGE: INTRAMUSCULAR | Status: DC | PRN

## 2019-05-20 SURGICAL SUPPLY — 71 items
APPLIER CLIP 5 13 M/L LIGAMAX5 (MISCELLANEOUS)
APPLIER CLIP ROT 10 11.4 M/L (STAPLE)
BLADE EXTENDED COATED 6.5IN (ELECTRODE) IMPLANT
BLADE HEX COATED 2.75 (ELECTRODE) IMPLANT
CABLE HIGH FREQUENCY MONO STRZ (ELECTRODE) ×3 IMPLANT
CELLS DAT CNTRL 66122 CELL SVR (MISCELLANEOUS) IMPLANT
CLIP APPLIE 5 13 M/L LIGAMAX5 (MISCELLANEOUS) IMPLANT
CLIP APPLIE ROT 10 11.4 M/L (STAPLE) IMPLANT
COVER SURGICAL LIGHT HANDLE (MISCELLANEOUS) ×6 IMPLANT
COVER WAND RF STERILE (DRAPES) ×3 IMPLANT
DECANTER SPIKE VIAL GLASS SM (MISCELLANEOUS) ×3 IMPLANT
DERMABOND ADVANCED (GAUZE/BANDAGES/DRESSINGS)
DERMABOND ADVANCED .7 DNX12 (GAUZE/BANDAGES/DRESSINGS) IMPLANT
DISSECTOR BLUNT TIP ENDO 5MM (MISCELLANEOUS) IMPLANT
DRAIN CHANNEL 19F RND (DRAIN) IMPLANT
DRAPE UTILITY XL STRL (DRAPES) ×3 IMPLANT
DRSG OPSITE POSTOP 4X10 (GAUZE/BANDAGES/DRESSINGS) IMPLANT
DRSG OPSITE POSTOP 4X6 (GAUZE/BANDAGES/DRESSINGS) IMPLANT
DRSG OPSITE POSTOP 4X8 (GAUZE/BANDAGES/DRESSINGS) IMPLANT
ELECT PENCIL ROCKER SW 15FT (MISCELLANEOUS) IMPLANT
ELECT REM PT RETURN 15FT ADLT (MISCELLANEOUS) ×3 IMPLANT
EVACUATOR SILICONE 100CC (DRAIN) IMPLANT
GAUZE SPONGE 4X4 12PLY STRL (GAUZE/BANDAGES/DRESSINGS) IMPLANT
GLOVE SURG SYN 7.5  E (GLOVE) ×4
GLOVE SURG SYN 7.5 E (GLOVE) ×2 IMPLANT
GOWN STRL REUS W/TWL XL LVL3 (GOWN DISPOSABLE) ×12 IMPLANT
HOLDER FOLEY CATH W/STRAP (MISCELLANEOUS) ×3 IMPLANT
KIT TURNOVER KIT A (KITS) ×3 IMPLANT
NEEDLE HYPO 25X1 1.5 SAFETY (NEEDLE) ×3 IMPLANT
PACK COLON (CUSTOM PROCEDURE TRAY) ×3 IMPLANT
PAD POSITIONING PINK XL (MISCELLANEOUS) ×3 IMPLANT
PENCIL SMOKE EVAC W/HOLSTER (ELECTROSURGICAL) ×6 IMPLANT
PORT LAP GEL ALEXIS MED 5-9CM (MISCELLANEOUS) ×3 IMPLANT
PROTECTOR NERVE ULNAR (MISCELLANEOUS) IMPLANT
RELOAD PROXIMATE 75MM BLUE (ENDOMECHANICALS) ×6 IMPLANT
RTRCTR WOUND ALEXIS 18CM MED (MISCELLANEOUS)
SET IRRIG TUBING LAPAROSCOPIC (IRRIGATION / IRRIGATOR) ×3 IMPLANT
SET TUBE SMOKE EVAC HIGH FLOW (TUBING) ×3 IMPLANT
SHEARS HARMONIC ACE PLUS 36CM (ENDOMECHANICALS) ×3 IMPLANT
SLEEVE ADV FIXATION 5X100MM (TROCAR) ×6 IMPLANT
STAPLER PROXIMATE 75MM BLUE (STAPLE) ×3 IMPLANT
STAPLER VISISTAT 35W (STAPLE) IMPLANT
SURGILUBE 2OZ TUBE FLIPTOP (MISCELLANEOUS) IMPLANT
SUT ETHILON 3 0 PS 1 (SUTURE) IMPLANT
SUT MNCRL AB 4-0 PS2 18 (SUTURE) ×3 IMPLANT
SUT NOVA NAB DX-16 0-1 5-0 T12 (SUTURE) IMPLANT
SUT PDS AB 1 CTX 36 (SUTURE) ×12 IMPLANT
SUT PDS AB 1 TP1 96 (SUTURE) IMPLANT
SUT PROLENE 2 0 KS (SUTURE) IMPLANT
SUT PROLENE 2 0 SH DA (SUTURE) IMPLANT
SUT SILK 2 0 (SUTURE) ×2
SUT SILK 2 0 SH CR/8 (SUTURE) ×6 IMPLANT
SUT SILK 2-0 18XBRD TIE 12 (SUTURE) ×1 IMPLANT
SUT SILK 3 0 (SUTURE) ×2
SUT SILK 3 0 SH CR/8 (SUTURE) ×9 IMPLANT
SUT SILK 3-0 18XBRD TIE 12 (SUTURE) ×1 IMPLANT
SUT VIC AB 2-0 SH 18 (SUTURE) ×6 IMPLANT
SUT VIC AB 2-0 SH 27 (SUTURE) ×2
SUT VIC AB 2-0 SH 27X BRD (SUTURE) ×1 IMPLANT
SUT VIC AB 3-0 SH 18 (SUTURE) ×3 IMPLANT
SYS LAPSCP GELPORT 120MM (MISCELLANEOUS)
SYSTEM LAPSCP GELPORT 120MM (MISCELLANEOUS) IMPLANT
TOWEL OR NON WOVEN STRL DISP B (DISPOSABLE) IMPLANT
TRAY FOLEY MTR SLVR 16FR STAT (SET/KITS/TRAYS/PACK) ×3 IMPLANT
TROCAR ADV FIXATION 11X100MM (TROCAR) IMPLANT
TROCAR ADV FIXATION 12X100MM (TROCAR) IMPLANT
TROCAR ADV FIXATION 5X100MM (TROCAR) ×3 IMPLANT
TROCAR BLADELESS OPT 5 100 (ENDOMECHANICALS) ×3 IMPLANT
TROCAR XCEL BLUNT TIP 100MML (ENDOMECHANICALS) IMPLANT
TUBING CONNECTING 10 (TUBING) ×4 IMPLANT
TUBING CONNECTING 10' (TUBING) ×2

## 2019-05-20 NOTE — Op Note (Addendum)
05/20/2019  11:02 AM  PATIENT:  Mark Hopkins, 33 y.o., male, MRN: [id number]  PREOP DIAGNOSIS:  HEPATIC FLEXURE COLON CANCER  POSTOP DIAGNOSIS:   Hepatic flexure colon cancer  PROCEDURE:   Procedure(s): LAPAROSCOPIC RIGHT HEMI COLECTOMY  SURGEON:   Alphonsa Overall, M.D.  ASSISTANT:   Jonny Ruiz, M.D.  ANESTHESIA:   general  Anesthesiologist: Janeece Riggers, MD CRNA: Eben Burow, CRNA; Key, Kristopher, CRNA  General  EBL:  75  ml  BLOOD ADMINISTERED: none  DRAINS: none   LOCAL MEDICATIONS USED:   20 cc of Exparel and 30 cc of 1/4% marcaine  SPECIMEN:   Right colon  COUNTS CORRECT:  YES  INDICATIONS FOR PROCEDURE:  Mark Hopkins is a 33 y.o. (DOB: 08/27/1985) white male whose primary care physician is Patient, No Pcp Per and comes for a right hemicolectomy for an hepatic flexure colon cancer.   The indications and risks of the surgery were explained to the patient.  The risks include, but are not limited to, infection, bleeding, and nerve injury.  He completed a mechanical and antibiotic prep at home.  PROCEDURE: The patient was taken to room #4 and underwent a general anesthesia.  He had a Foley catheter placed, an OG catheter placed, his abdomen is prepped with ChloraPrep and sterilely draped.  A timeout was held and the surgical checklist run.  I accessed his abdominal cavity with a 5 mm Optiview Ethicon trocar in the left upper quadrant.  I placed 3 additional trochars: one 5 mm trocar left midabdomen, one 5 mm trocar left lower quadrant, and one 5 mm trocar right lower quadrant.  I did a TAP block bilateral with Exparel and 1/4% marcaine mixture - 15 cc per side.  I carried out abdominal exploration.  His liver was unremarkable without nodule or mass.  His gallbladder was unremarkable.  The anterior wall of the stomach was unremarkable.  He had staining in the right upper quadrant from the injection to mark the hepatic flexure tumor.  There were also specks  of the dye in the peritoneal surface around the abdomen.  There was no gross evidence of any metastatic cancer.  The preop CT scan saw two nodules in the LUQ of the abdomen, but I could not find these on my exploration.     I first mobilized the right colon starting with the peritoneal reflection at the pelvic brim.  I was able to reflect the right colon anteriorly and I iIdentified the duodenum.  I took down lateral adhesions of the right colon.  I then took the transverse colon off the stomach by dividing the gastrocolic ligament.  Tattoo dye marked the cancer which was at the hepatic flexure.  Once I thought I had adequate mobility of the right colon, I made a laparotomy incision.  I made a transverse incision midway between the umbilicus and the right costal margin.  I eviscerated the right colon and hepatic flexure.  I divided the small bowel with a 70 mm Ethicon GIA stapler.  I divided the right transverse colon with a 70 mm Ethicon GIA stapler.  I divided the mesentery with harmonic scalpel and 2-0 silk sutures.  I tried to close mesenteric defect between the right transverse colon and small bowel mesentery with a 2-0 vicryl suture.  I then carried out a side-to-side anastomosis between the terminal ileum and the right transverse colon with a 70 mm Ethicon GIA stapler. I placed a "crotch" suture of 2-0 silk.  I closed the enterotomy with interrupted 3-0 silk sutures.  The anastomosis looked good with healthy tissue and at least a three finger breath anastomosis.    I then returned the anastomosis to the abdominal cavity and irrigated the abdomen with 2 L of saline.  I relaparoscoped the patient and saw no evidence of any significant bleeding and the anastomosis seemed to lie in the right upper quadrant in normal fashion and under no tension.  We then opened a clean operative set to close the abdomen.  I closed the right upper abdominal incision in 2 layers closing the posterior and anterior rectus  fascia with a separate running #1 PDS suture.  I re-laparoscoped the patient after the fascial closure and saw no bleeding or other area of concern.  I infiltrated 20 cc of local anesthetic in the wound.  I irrigated the right upper part wound with saline and closed the subcutaneous tissues with 3-0 Vicryl suture and the skin with a 4-0 Monocryl suture.  The trochars were then removed and the trochar sites were closed with 4-0 Monocryl suture.  The patient tolerated procedure well and was transferred to recovery in good condition.  Alphonsa Overall, MD, [facility] Surgery Office phone:  [phone]

## 2019-05-20 NOTE — Interval H&P Note (Signed)
History and Physical Interval Note:  05/20/2019 7:08 AM  Mark Hopkins  has presented today for surgery, with the diagnosis of HEPATIC FLEXURE COLON CANCER.   His wife is at home.  The various methods of treatment have been discussed with the patient and family. After consideration of risks, benefits and other options for treatment, the patient has consented to  Procedure(s): LAPAROSCOPIC RIGHT HEMI COLECTOMY (Right) as a surgical intervention.  The patient's history has been reviewed, patient examined, no change in status, stable for surgery.  I have reviewed the patient's chart and labs.  Questions were answered to the patient's satisfaction.     Shann Medal

## 2019-05-20 NOTE — Anesthesia Postprocedure Evaluation (Signed)
Anesthesia Post Note  Patient: Joseramon Zuba  Procedure(s) Performed: LAPAROSCOPIC RIGHT HEMI COLECTOMY (Right Abdomen)     Patient location during evaluation: PACU Anesthesia Type: General Level of consciousness: awake and alert Pain management: pain level controlled Vital Signs Assessment: post-procedure vital signs reviewed and stable Respiratory status: spontaneous breathing, nonlabored ventilation, respiratory function stable and patient connected to nasal cannula oxygen Cardiovascular status: blood pressure returned to baseline and stable Postop Assessment: no apparent nausea or vomiting Anesthetic complications: no    Last Vitals:  Vitals:   05/20/19 1230 05/20/19 1253  BP: (!) 148/94 (!) 151/95  Pulse: 91 95  Resp: 17 16  Temp: 36.5 C 37.3 C  SpO2: 94% 100%    Last Pain:  Vitals:   05/20/19 1253  TempSrc: Oral  PainSc:                  Lara Palinkas

## 2019-05-20 NOTE — Anesthesia Procedure Notes (Signed)
Procedure Name: Intubation Date/Time: 05/20/2019 7:25 AM Performed by: Eben Burow, CRNA Pre-anesthesia Checklist: Patient identified, Emergency Drugs available, Suction available, Patient being monitored and Timeout performed Patient Re-evaluated:Patient Re-evaluated prior to induction Oxygen Delivery Method: Circle system utilized Preoxygenation: Pre-oxygenation with 100% oxygen Induction Type: IV induction Ventilation: Mask ventilation without difficulty Laryngoscope Size: Mac and 4 Grade View: Grade I Tube type: Oral Number of attempts: 1 Airway Equipment and Method: Stylet Placement Confirmation: ETT inserted through vocal cords under direct vision,  positive ETCO2 and breath sounds checked- equal and bilateral Secured at: 22 cm Tube secured with: Tape Dental Injury: Teeth and Oropharynx as per pre-operative assessment

## 2019-05-20 NOTE — Plan of Care (Signed)
Patient arrived to floor from PACU via stretcher; transferred to bed with no difficulty. No needs expressed at this time. Will continue to monitor.

## 2019-05-20 NOTE — Transfer of Care (Signed)
Immediate Anesthesia Transfer of Care Note  Patient: Mark Hopkins  Procedure(s) Performed: LAPAROSCOPIC RIGHT HEMI COLECTOMY (Right Abdomen)  Patient Location: PACU  Anesthesia Type:General  Level of Consciousness: drowsy and responds to stimulation  Airway & Oxygen Therapy: Patient Spontanous Breathing and Patient connected to face mask oxygen  Post-op Assessment: Report given to RN and Post -op Vital signs reviewed and stable  Post vital signs: Reviewed and stable  Last Vitals:  Vitals Value Taken Time  BP 156/101 05/20/19 1118  Temp    Pulse 91 05/20/19 1120  Resp 20 05/20/19 1120  SpO2 100 % 05/20/19 1120  Vitals shown include unvalidated device data.  Last Pain:  Vitals:   05/20/19 0526  TempSrc: Oral      Patients Stated Pain Goal: 3 ([id number])  Complications: No apparent anesthesia complications

## 2019-05-21 ENCOUNTER — Encounter (HOSPITAL_COMMUNITY): Payer: Self-pay | Admitting: Surgery

## 2019-05-21 LAB — BASIC METABOLIC PANEL
Anion gap: 11 (ref 5–15)
BUN: 8 mg/dL (ref 6–20)
CO2: 23 mmol/L (ref 22–32)
Calcium: 8.8 mg/dL — ABNORMAL LOW (ref 8.9–10.3)
Chloride: 102 mmol/L (ref 98–111)
Creatinine, Ser: 0.95 mg/dL (ref 0.61–1.24)
GFR calc Af Amer: >60 mL/min (ref >60)
GFR calc non Af Amer: >60 mL/min (ref >60)
Glucose, Bld: 114 mg/dL — ABNORMAL HIGH (ref 70–99)
Potassium: 3.7 mmol/L (ref 3.5–5.1)
Sodium: 136 mmol/L (ref 135–145)

## 2019-05-21 LAB — CBC
HCT: 38.1 % — ABNORMAL LOW (ref 39.0–52.0)
Hemoglobin: 11.9 g/dL — ABNORMAL LOW (ref 13.0–17.0)
MCH: 26.9 pg (ref 26.0–34.0)
MCHC: 31.2 g/dL (ref 30.0–36.0)
MCV: 86.2 fL (ref 80.0–100.0)
Platelets: 360 10*3/uL (ref 150–400)
RBC: 4.42 MIL/uL (ref 4.22–5.81)
RDW: 13.7 % (ref 11.5–15.5)
WBC: 11.1 10*3/uL — ABNORMAL HIGH (ref 4.0–10.5)
nRBC: 0 % (ref 0.0–0.2)

## 2019-05-21 NOTE — Progress Notes (Signed)
[facility] Office:  [phone] General Surgery Progress Note   LOS: 1 day  POD -  1 Day Post-Op  Chief Complaint: Hepatic flexure colon cancer  Assessment and Plan: 1.  LAPAROSCOPIC RIGHT HEMI COLECTOMY - 05/20/2019 Lucia Gaskins  On clear liquids.  Needs to ambulate more.  2.  DVT prophylaxis - Lovenox 3.  Foley - to remove  Active Problems:   Primary cancer of hepatic flexure of colon (HCC)  Subjective:  His back was his main issue last night.  But he has been up to walk and that is somewhat better.  Objective:   Vitals:   05/21/19 0304 05/21/19 0547  BP: (!) 143/96 (!) 140/98  Pulse: 88 94  Resp: 18 18  Temp: 98.1 F (36.7 C) 98.6 F (37 C)  SpO2: 100% 100%     Intake/Output from previous day:  11/12 0701 - 11/13 0700 In: 5006.1 [P.O.:460; I.V.:4196.1; IV Piggyback:350] Out: 3020 [Urine:2945; Blood:75]  Intake/Output this shift:  Total I/O In: 1516.1 [P.O.:340; I.V.:1176.1] Out: 1380 [Urine:1380]   Physical Exam:   General: WN wM who is alert and oriented.    HEENT: Normal. Pupils equal. .   Lungs: Clear.  IS = 1,000cc   Abdomen: Soft.  Rare BS.   Wound: Clean   Lab Results:    Recent Labs    05/21/19 0344  WBC 11.1*  HGB 11.9*  HCT 38.1*  PLT 360    BMET   Recent Labs    05/21/19 0344  NA 136  K 3.7  CL 102  CO2 23  GLUCOSE 114*  BUN 8  CREATININE 0.95  CALCIUM 8.8*    PT/INR  No results for input(s): LABPROT, INR in the last 72 hours.  ABG  No results for input(s): PHART, HCO3 in the last 72 hours.  Invalid input(s): PCO2, PO2   Studies/Results:  No results found.   Anti-infectives:   Anti-infectives (From admission, onward)   Start     Dose/Rate Route Frequency Ordered Stop   05/20/19 0600  cefoTEtan (CEFOTAN) 2 g in sodium chloride 0.9 % 100 mL IVPB     2 g 200 mL/hr over 30 Minutes Intravenous On call to O.R. 05/20/19 0527 05/21/19 0615   05/20/19 0531  sodium chloride 0.9 % with cefoTEtan (CEFOTAN) ADS Med     Note to Pharmacy: Randa Evens  : cabinet override      05/20/19 0531 05/20/19 0726      Alphonsa Overall, MD, [facility] Surgery Office: [phone] 05/21/2019

## 2019-05-22 LAB — CBC WITH DIFFERENTIAL/PLATELET
Abs Immature Granulocytes: 0.03 10*3/uL (ref 0.00–0.07)
Basophils Absolute: 0 10*3/uL (ref 0.0–0.1)
Basophils Relative: 0 %
Eosinophils Absolute: 0.1 10*3/uL (ref 0.0–0.5)
Eosinophils Relative: 1 %
HCT: 39 % (ref 39.0–52.0)
Hemoglobin: 12 g/dL — ABNORMAL LOW (ref 13.0–17.0)
Immature Granulocytes: 0 %
Lymphocytes Relative: 18 %
Lymphs Abs: 1.8 10*3/uL (ref 0.7–4.0)
MCH: 26.8 pg (ref 26.0–34.0)
MCHC: 30.8 g/dL (ref 30.0–36.0)
MCV: 87.2 fL (ref 80.0–100.0)
Monocytes Absolute: 1.1 10*3/uL — ABNORMAL HIGH (ref 0.1–1.0)
Monocytes Relative: 11 %
Neutro Abs: 7.2 10*3/uL (ref 1.7–7.7)
Neutrophils Relative %: 70 %
Platelets: 363 10*3/uL (ref 150–400)
RBC: 4.47 MIL/uL (ref 4.22–5.81)
RDW: 13.8 % (ref 11.5–15.5)
WBC: 10.2 10*3/uL (ref 4.0–10.5)
nRBC: 0 % (ref 0.0–0.2)

## 2019-05-22 LAB — BASIC METABOLIC PANEL
Anion gap: 10 (ref 5–15)
BUN: 6 mg/dL (ref 6–20)
CO2: 24 mmol/L (ref 22–32)
Calcium: 9.3 mg/dL (ref 8.9–10.3)
Chloride: 103 mmol/L (ref 98–111)
Creatinine, Ser: 0.89 mg/dL (ref 0.61–1.24)
GFR calc Af Amer: >60 mL/min (ref >60)
GFR calc non Af Amer: >60 mL/min (ref >60)
Glucose, Bld: 105 mg/dL — ABNORMAL HIGH (ref 70–99)
Potassium: 3.8 mmol/L (ref 3.5–5.1)
Sodium: 137 mmol/L (ref 135–145)

## 2019-05-22 NOTE — Progress Notes (Addendum)
[place] [facility] Office:  [phone] General Surgery Progress Note   LOS: 2 days  POD -  2 Days Post-Op  Chief Complaint: Hepatic flexure colon cancer  Assessment and Plan: 1.  LAPAROSCOPIC RIGHT HEMI COLECTOMY - 05/20/2019 Lucia Gaskins  On clear liquids - advance to full liquids today  Ambulate 5x/day  Continue Entereg until reliable return of bowel fxn  Multiple undocumented voids - having to get up to pee a lot - D/C IVF  2.  DVT prophylaxis - Lovenox  Active Problems:   Primary cancer of hepatic flexure of colon (HCC)  Subjective:  Feeling better today.  Denies n/v. Has been passing fair amount of flatus; no bm yet. Tolerating clear liquids without any issues. Up in chair this morning.  Objective:   Vitals:   05/21/19 2324 05/22/19 0544  BP: (!) 143/87 135/81  Pulse: 94 97  Resp: 18 18  Temp: 99.2 F (37.3 C) 98.5 F (36.9 C)  SpO2: 97% 96%     Intake/Output from previous day:  11/13 0701 - 11/14 0700 In: 3281.9 [P.O.:930; I.V.:2351.9] Out: 475 [Urine:375]  Intake/Output this shift:  No intake/output data recorded.   Physical Exam:   General: WN wM who is alert and oriented   HEENT: Normal. Pupils equal. .   Lungs: normal work of breathing   Abdomen: Soft. Expected tenderness around incision; no tenderness elsewhere; not significantly distended; incisions c/d/i without erythema nor drainage   Lab Results:    Recent Labs    05/21/19 0344 05/22/19 0333  WBC 11.1* 10.2  HGB 11.9* 12.0*  HCT 38.1* 39.0  PLT 360 363    BMET   Recent Labs    05/21/19 0344 05/22/19 0333  NA 136 137  K 3.7 3.8  CL 102 103  CO2 23 24  GLUCOSE 114* 105*  BUN 8 6  CREATININE 0.95 0.89  CALCIUM 8.8* 9.3    PT/INR  No results for input(s): LABPROT, INR in the last 72 hours.  ABG  No results for input(s): PHART, HCO3 in the last 72 hours.  Invalid input(s): PCO2, PO2   Studies/Results:  No results found.   Anti-infectives:   Anti-infectives (From  admission, onward)   Start     Dose/Rate Route Frequency Ordered Stop   05/20/19 0600  cefoTEtan (CEFOTAN) 2 g in sodium chloride 0.9 % 100 mL IVPB     2 g 200 mL/hr over 30 Minutes Intravenous On call to O.R. 05/20/19 0527 05/21/19 0615   05/20/19 0531  sodium chloride 0.9 % with cefoTEtan (CEFOTAN) ADS Med    Note to Pharmacy: Randa Evens  : cabinet override      05/20/19 0531 05/20/19 0726     Nadeen Landau, M.D. [facility] Use [email or web address] to contact on call provider

## 2019-05-22 NOTE — Plan of Care (Signed)
Patient sitting up in chair in room; states sitting up helps chronic back pain. No other concerns voiced at this time. Will continue to monitor.

## 2019-05-23 NOTE — Plan of Care (Signed)
Patient sitting up in chair this morning; pain controlled at this time. No needs expressed. Will continue to monitor.

## 2019-05-23 NOTE — Progress Notes (Signed)
[facility] Office:  [phone] General Surgery Progress Note   LOS: 3 days  POD -  3 Days Post-Op  Chief Complaint: Hepatic flexure colon cancer  Assessment and Plan: 1.  LAPAROSCOPIC RIGHT HEMI COLECTOMY - 05/20/2019 - [place]  On soft diet, continue  Ambulate 5x/day  D/C Entereg today  H&H in AM ordered given 2 bloody BMs  2.  DVT prophylaxis - Lovenox - hold today given bloody bms  Active Problems:   Primary cancer of hepatic flexure of colon (HCC)  Subjective:  Doing reasonably well. Intermittent crampy abdominal aches and some mild bloating but now having BMs - 2 were bloody, 1 was not. Continues to pass flatus. Tolerating soft diet and denies n/v. Up in chair again this morning.  Objective:   Vitals:   05/22/19 2156 05/23/19 0518  BP: 133/89 (!) 143/91  Pulse: 90 95  Resp: 18 18  Temp: 98.2 F (36.8 C) 99 F (37.2 C)  SpO2: 97% 99%     Intake/Output from previous day:  11/14 0701 - 11/15 0700 In: 660 [P.O.:660] Out: 850 [Urine:850]  Intake/Output this shift:  No intake/output data recorded.   Physical Exam:   General: WN wM who is alert and oriented   HEENT: Normal. Pupils equal. .   Lungs: normal work of breathing   Abdomen: Soft. Expected tenderness around incision; no tenderness elsewhere; not significantly distended; incisions c/d/i without erythema nor drainage   Lab Results:    Recent Labs    05/21/19 0344 05/22/19 0333  WBC 11.1* 10.2  HGB 11.9* 12.0*  HCT 38.1* 39.0  PLT 360 363    BMET   Recent Labs    05/21/19 0344 05/22/19 0333  NA 136 137  K 3.7 3.8  CL 102 103  CO2 23 24  GLUCOSE 114* 105*  BUN 8 6  CREATININE 0.95 0.89  CALCIUM 8.8* 9.3    PT/INR  No results for input(s): LABPROT, INR in the last 72 hours.  ABG  No results for input(s): PHART, HCO3 in the last 72 hours.  Invalid input(s): PCO2, PO2   Studies/Results:  No results found.   Anti-infectives:   Anti-infectives (From admission,  onward)   Start     Dose/Rate Route Frequency Ordered Stop   05/20/19 0600  cefoTEtan (CEFOTAN) 2 g in sodium chloride 0.9 % 100 mL IVPB     2 g 200 mL/hr over 30 Minutes Intravenous On call to O.R. 05/20/19 0527 05/21/19 0615   05/20/19 0531  sodium chloride 0.9 % with cefoTEtan (CEFOTAN) ADS Med    Note to Pharmacy: Randa Evens  : cabinet override      05/20/19 0531 05/20/19 0726     Nadeen Landau, M.D. [facility] Use [email or web address] to contact on call provider

## 2019-05-24 LAB — HEMOGLOBIN AND HEMATOCRIT, BLOOD
HCT: 38.3 % — ABNORMAL LOW (ref 39.0–52.0)
Hemoglobin: 11.9 g/dL — ABNORMAL LOW (ref 13.0–17.0)

## 2019-05-24 MED ORDER — OXYCODONE HCL 5 MG PO TABS: 5.0000 mg | ORAL_TABLET | ORAL | 0 refills | Status: DC | PRN

## 2019-05-24 NOTE — Plan of Care (Signed)
All goals met for d/c 

## 2019-05-24 NOTE — Progress Notes (Signed)
Pt alert and oriented, tolerating diet. D/C instructions given, pt d/cd to home. 

## 2019-05-24 NOTE — Discharge Summary (Signed)
Physician Discharge Summary  Patient ID:  Mark Hopkins  MRN: [id number]  DOB/AGE: 1985-10-10 33 y.o.  Admit date: 05/20/2019 Discharge date: 05/24/2019  Discharge Diagnoses:   Active Problems:   Primary cancer of hepatic flexure of colon ([place]) (final pathology pending)  Operation: Procedure(s):  LAPAROSCOPIC RIGHT HEMI COLECTOMY on 05/20/2019 Lucia Gaskins  Discharged Condition: good  Hospital Course: Mark Hopkins is an 33 y.o. male whose primary care physician is Jenny Reichmann, [place] and who was admitted 05/20/2019 with a chief complaint of hepatic flexure colon cancer.   He was brought to the operating room on 05/20/2019 and underwent  LAPAROSCOPIC RIGHT HEMI COLECTOMY.  He had a lot of back trouble the first day after surgery.  He had some bloody bowel movements on Sunday.  His Hgb is stable at 11.9.  He is now 4 days post op.  He is tolerating a diet, he is having loose bowel movements, and his pain seems controlled. His pathology is still pending. He is ready to go home.  The discharge instructions were reviewed with the patient.  Consults: None  Significant Diagnostic Studies: Results for orders placed or performed during the hospital encounter of [id number]  Basic metabolic panel  Result Value Ref Range   Sodium 136 135 - 145 mmol/L   Potassium 3.7 3.5 - 5.1 mmol/L   Chloride 102 98 - 111 mmol/L   CO2 23 22 - 32 mmol/L   Glucose, Bld 114 (H) 70 - 99 mg/dL   BUN 8 6 - 20 mg/dL   Creatinine, Ser 0.95 0.61 - 1.24 mg/dL   Calcium 8.8 (L) 8.9 - 10.3 mg/dL   GFR calc non Af Amer >60 >60 mL/min   GFR calc Af Amer >60 >60 mL/min   Anion gap 11 5 - 15  CBC  Result Value Ref Range   WBC 11.1 (H) 4.0 - 10.5 K/uL   RBC 4.42 4.22 - 5.81 MIL/uL   Hemoglobin 11.9 (L) 13.0 - 17.0 g/dL   HCT 38.1 (L) 39.0 - 52.0 %   MCV 86.2 80.0 - 100.0 fL   MCH 26.9 26.0 - 34.0 pg   MCHC 31.2 30.0 - 36.0 g/dL   RDW 13.7 11.5 - 15.5 %   Platelets 360 150 - 400 K/uL   nRBC 0.0  0.0 - 0.2 %  CBC with Differential in AM  Result Value Ref Range   WBC 10.2 4.0 - 10.5 K/uL   RBC 4.47 4.22 - 5.81 MIL/uL   Hemoglobin 12.0 (L) 13.0 - 17.0 g/dL   HCT 39.0 39.0 - 52.0 %   MCV 87.2 80.0 - 100.0 fL   MCH 26.8 26.0 - 34.0 pg   MCHC 30.8 30.0 - 36.0 g/dL   RDW 13.8 11.5 - 15.5 %   Platelets 363 150 - 400 K/uL   nRBC 0.0 0.0 - 0.2 %   Neutrophils Relative % 70 %   Neutro Abs 7.2 1.7 - 7.7 K/uL   Lymphocytes Relative 18 %   Lymphs Abs 1.8 0.7 - 4.0 K/uL   Monocytes Relative 11 %   Monocytes Absolute 1.1 (H) 0.1 - 1.0 K/uL   Eosinophils Relative 1 %   Eosinophils Absolute 0.1 0.0 - 0.5 K/uL   Basophils Relative 0 %   Basophils Absolute 0.0 0.0 - 0.1 K/uL   Immature Granulocytes 0 %   Abs Immature Granulocytes 0.03 0.00 - 0.07 K/uL  Basic metabolic panel in AM  Result Value Ref Range   Sodium  137 135 - 145 mmol/L   Potassium 3.8 3.5 - 5.1 mmol/L   Chloride 103 98 - 111 mmol/L   CO2 24 22 - 32 mmol/L   Glucose, Bld 105 (H) 70 - 99 mg/dL   BUN 6 6 - 20 mg/dL   Creatinine, Ser 0.89 0.61 - 1.24 mg/dL   Calcium 9.3 8.9 - 10.3 mg/dL   GFR calc non Af Amer >60 >60 mL/min   GFR calc Af Amer >60 >60 mL/min   Anion gap 10 5 - 15  Hemoglobin and hematocrit, blood  Result Value Ref Range   Hemoglobin 11.9 (L) 13.0 - 17.0 g/dL   HCT 38.3 (L) 39.0 - 52.0 %    No results found.  Discharge Exam:  Vitals:   05/23/19 2124 05/24/19 0526  BP: (!) 128/94 136/81  Pulse: 84 97  Resp: 16 18  Temp: 98.2 F (36.8 C) 98.3 F (36.8 C)  SpO2: 95% 99%    General: WN WM who is alert and generally healthy appearing.  Lungs: Clear to auscultation and symmetric breath sounds. Heart:  RRR. No murmur or rub. Abdomen: Soft. No mass. No hernia. Some bowel sounds.  Incisions look good.  Discharge Medications:   Allergies as of 05/24/2019      Reactions   Chlorhexidine       Medication List    TAKE these medications   acetaminophen 500 MG tablet Commonly known as:  TYLENOL Take 1,000 mg by mouth every 6 (six) hours as needed for moderate pain.   ALKA-SELTZER ANTACID PO Take 1 tablet by mouth daily as needed (heartburn).   dicyclomine 10 MG capsule Commonly known as: BENTYL Take 1 capsule (10 mg total) by mouth every 6 (six) hours as needed for spasms.   ondansetron 8 MG disintegrating tablet Commonly known as: Zofran ODT Take 1 tablet (8 mg total) by mouth every 8 (eight) hours as needed for nausea.   oxyCODONE 5 MG immediate release tablet Commonly known as: Oxy IR/ROXICODONE Take 1 tablet (5 mg total) by mouth every 4 (four) hours as needed for moderate pain.       Disposition:    Signed: Alphonsa Overall, M.D., [facility] Office:  [phone]  05/24/2019, 9:55 AM

## 2019-05-24 NOTE — Discharge Instructions (Signed)
[facility] - DISCHARGE INSTRUCTIONS TO PATIENT  Activity:  Driving - May drive in 2 to 4 days, if doing well and off pain meds   Lifting - No lifting more than 15 pounds for 3 weeks.                       Practice your Covid-19 protection:  Wear a mask, social distance, and wash your hands frequently  Wound Care:   You may shower  Diet:  As tolerated.     Drink plenty of fluids.  Follow up appointment:  Call Dr. Pollie Friar office [facility]) at [phone] for an appointment in 2 to 4 weeks.  Medications and dosages:  Resume your home medications.  You have a prescription for:  Oxycodone  Call Dr. Lucia Gaskins or his office  [phone]) if you have:  Temperature greater than 100.4,  Persistent nausea and vomiting,  Severe uncontrolled pain,  Redness, tenderness, or signs of infection (pain, swelling, redness, odor or green/yellow discharge around the site),  Any other questions or concerns you may have after discharge.  In an emergency, call 911 or go to an Emergency Department at a nearby hospital.

## 2019-05-28 LAB — SURGICAL PATHOLOGY

## 2019-06-29 ENCOUNTER — Inpatient Hospital Stay: Payer: 59 | Attending: Oncology | Admitting: Oncology

## 2019-06-29 ENCOUNTER — Inpatient Hospital Stay: Payer: 59

## 2019-06-29 ENCOUNTER — Other Ambulatory Visit: Payer: Self-pay

## 2019-06-29 VITALS — BP 149/91 | HR 87 | Temp 98.5°F | Resp 18 | Ht 73.0 in | Wt 192.4 lb

## 2019-06-29 DIAGNOSIS — Z800 Family history of malignant neoplasm of digestive organs: Secondary | ICD-10-CM | POA: Insufficient documentation

## 2019-06-29 DIAGNOSIS — C183 Malignant neoplasm of hepatic flexure: Secondary | ICD-10-CM | POA: Diagnosis not present

## 2019-06-29 DIAGNOSIS — Z803 Family history of malignant neoplasm of breast: Secondary | ICD-10-CM | POA: Diagnosis not present

## 2019-06-29 NOTE — Progress Notes (Signed)
[place] New Patient Consult   Requesting MD: Tania Perrott 33 y.o.  [birth year]    Reason for Consult: Colon cancer   HPI: MarkOhlrich reports having loose stools for "months ".  This occurred eating.  He had episodes of rectal bleeding in July and August.  He was seen in the emergency room on 02/23/2019.  The stool was Hemoccult positive.  A CT of the abdomen and pelvis on 02/23/2019 revealed multiple too small to characterize hypoattenuation lesions throughout the liver.  Soft tissue thickening versus stool at the hepatic flexure.  No enlarged abdominal pelvic lymph nodes.  2 soft tissue nodules in the left upper quadrant measures 7 and 6 mm.  He was referred to Dr. Henrene Pastor. A colonoscopy on 03/16/2019 revealed a mass in the hepatic flexure.  The mass was biopsied and the area was tattooed.  The exam was otherwise without abnormality.  The pathology revealed adenocarcinoma.  A CT of the chest on 03/25/2019 revealed no evidence of metastatic disease.    He was referred to Dr. Lucia Gaskins and was taken to a laparoscopic right hemicolectomy on 05/20/2019.  Liver appeared unremarkable.  The tattoo was noted at the hepatic flexure.  No evidence of metastatic disease.  The left upper quadrant nodules noted on the preoperative CT were not seen.  An anastomosis was created between the terminal ileum and right transverse colon.  The pathology [phone] [phone]) revealed a moderately differentiated adenocarcinoma of the hepatic flexure.  Tumor invaded the muscularis propria.  The resection margins are negative.  No lymphovascular or perineural invasion.  No tumor deposits.  No macroscopic tumor perforation.  0 of 17 lymph nodes contained metastatic carcinoma.  The mismatch repair protein expression returned normal.  The tumor is MSI stable.  He reports the diarrhea has resolved.  His bowels are functioning normally.  Past Medical History:  Diagnosis Date  .  Colon cancer,  hepatic flexure, T2N0  05/20/2019  . Change in bowel habits   . Chronic back pain   . Depression   . Fatigue   . Headache(784.0)   . Migraine   . Muscle pain   . Nausea & vomiting   . Night sweats   . Rectal pain   . Urinary tract infection     Past Surgical History:  Procedure Laterality Date  . LAPAROSCOPIC RIGHT HEMI COLECTOMY Right 05/20/2019   Procedure: LAPAROSCOPIC RIGHT HEMI COLECTOMY;  Surgeon: Alphonsa Overall, MD;  Location: [facility];  Service: General;  Laterality: Right;    Medications: Reviewed  Allergies:  Allergies  Allergen Reactions  . Chlorhexidine     Family history: His maternal grandfather had colon cancer.  His paternal grandmother had breast cancer.    Social History:   He lives with his wife and 3 children in [place].  He works as a Financial planner.  He is not use cigarettes or alcohol.  No transfusion history.  No risk factor for HIV or hepatitis.  ROS:   Positives include: Anorexia and 15-20 pound weight loss prior to surgery, occasional night sweats, postprandial diarrhea prior to surgery, rectal bleeding prior to surgery  A complete ROS was otherwise negative.  Physical Exam:  Blood pressure (!) 149/91, pulse 87, temperature 98.5 F (36.9 C), temperature source Temporal, resp. rate 18, height '6\' 1"'  (1.854 m), weight 192 lb 6.4 oz (87.3 kg), SpO2 99 %. Limited physical examination secondary to distancing with the Covid pandemic HEENT: Neck without mass Lungs: Clear bilaterally Cardiac:  Regular rate and rhythm Abdomen: Nontender, no hepatosplenomegaly, no mass, healed surgical incisions  Vascular: No leg edema Lymph nodes: No cervical, supraclavicular, axillary, or inguinal nodes Neurologic: Alert and oriented, the motor exam appears intact in the upper and lower extremities bilaterally Skin: No rash Musculoskeletal: No spine tenderness   LAB:  CBC  Lab Results  Component Value Date   WBC 10.2 05/22/2019   HGB 11.9 (L)  05/24/2019   HCT 38.3 (L) 05/24/2019   MCV 87.2 05/22/2019   PLT 363 05/22/2019   NEUTROABS 7.2 05/22/2019        CMP  Lab Results  Component Value Date   NA 137 05/22/2019   K 3.8 05/22/2019   CL 103 05/22/2019   CO2 24 05/22/2019   GLUCOSE 105 (H) 05/22/2019   BUN 6 05/22/2019   CREATININE 0.89 05/22/2019   CALCIUM 9.3 05/22/2019   PROT 7.9 05/13/2019   ALBUMIN 4.3 05/13/2019   AST 17 05/13/2019   ALT 19 05/13/2019   ALKPHOS 66 05/13/2019   BILITOT 0.7 05/13/2019   GFRNONAA >60 05/22/2019   GFRAA >60 05/22/2019   04/01/2019 -CEA 3.1  Imaging:  As per HPI, CT abdomen/pelvis 02/23/2019, images reviewed   Assessment/Plan:   1. 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 Family history of multiple cancers   Disposition:   Mark Hopkins has been diagnosed with stage I colon cancer.  I discussed the prognosis details of the surgical pathology report with him today.  We reviewed the 02/23/2019 CT images.  He has a good prognosis for a long-term disease-free survival.  There is no indication for adjuvant systemic therapy.  The small indeterminate liver lesions are likely benign as are the left upper quadrant soft tissue nodules.  His case at the GI tumor conference to consider the indication for imaging.  He does not appear to have hereditary nonpolyposis colon cancer syndrome, but his family members are at increased risk of developing  colorectal cancer and should receive appropriate screening.  We made a referral to the genetics counselor.  We discussed diet and exercise maneuvers that may decrease the risk of developing colorectal cancer.  He plans to continue colonoscopy surveillance with Dr. Henrene Pastor.  Mr. Vale will return to the lab for a CEA today.  He will return for an office visit and CEA in 6 months.  Betsy Coder, MD  06/29/2019, 2:00 PM

## 2019-06-30 ENCOUNTER — Telehealth: Payer: Self-pay | Admitting: General Practice

## 2019-06-30 LAB — CEA (IN HOUSE-CHCC): CEA (CHCC-In House): <1 ng/mL (ref 0.00–5.00)

## 2019-06-30 NOTE — Telephone Encounter (Signed)
[place] CSW Progress Notes  Request received from Merceda Elks RN to talk to patient who had expressed anxiety regarding cancer diagnosis.  Called patient - his needs have changed since yesterday's visit w Dr Benay Spice as follows:  Patient and family psychosocial functioning including strengths, limitations, and coping skills:  New diagnosis of Stage 1 cancer, "had a really good visit w oncologist yesterday, got a lot of reassurance about my situation."  Felt very good about visit w oncologist yesterday.  Has had surgery, no plans for further treatment at this time.  Will be in surveillance.  No needs at this time - feels very relieved by guidance provided by oncologist.    Clinical Social Worker follow up needed: No.  Please reconsult if needed.  Edwyna Shell, LCSW Clinical Social Worker Phone:  [phone]

## 2019-07-01 ENCOUNTER — Telehealth: Payer: Self-pay | Admitting: Oncology

## 2019-07-01 NOTE — Telephone Encounter (Signed)
Scheduled per los. Called and left msg. Mailed printout  °

## 2019-07-08 ENCOUNTER — Telehealth: Payer: Self-pay | Admitting: *Deleted

## 2019-07-08 DIAGNOSIS — C183 Malignant neoplasm of hepatic flexure: Secondary | ICD-10-CM

## 2019-07-08 NOTE — Telephone Encounter (Signed)
Notified patient of results of discussion of his preop CT scan with radiology physician. The tiny liver lesions are likely cysts or hemangiomas. Suggest repeat MRI liver w/without contrast 3-4 months from prior CT to confirm this. Also it is strongly recommended that he see genetics counselor. Patient agrees to both appointments. Orders placed.

## 2019-07-12 ENCOUNTER — Telehealth: Payer: Self-pay | Admitting: Oncology

## 2019-07-12 NOTE — Telephone Encounter (Signed)
Scheduled appt per 12/31 sch message - pt is aware of appt date and time

## 2019-07-15 ENCOUNTER — Encounter: Payer: Self-pay | Admitting: Licensed Clinical Social Worker

## 2019-07-15 ENCOUNTER — Inpatient Hospital Stay: Payer: 59 | Attending: Oncology | Admitting: Licensed Clinical Social Worker

## 2019-07-15 DIAGNOSIS — Z803 Family history of malignant neoplasm of breast: Secondary | ICD-10-CM | POA: Diagnosis not present

## 2019-07-15 DIAGNOSIS — Z801 Family history of malignant neoplasm of trachea, bronchus and lung: Secondary | ICD-10-CM | POA: Diagnosis not present

## 2019-07-15 DIAGNOSIS — C183 Malignant neoplasm of hepatic flexure: Secondary | ICD-10-CM | POA: Diagnosis not present

## 2019-07-15 DIAGNOSIS — Z800 Family history of malignant neoplasm of digestive organs: Secondary | ICD-10-CM

## 2019-07-15 NOTE — Progress Notes (Signed)
REFERRING PROVIDER: Ladell Pier, MD [address] [place],  [place] [address]  PRIMARY PROVIDER:  Jenny Reichmann, PA-C  PRIMARY REASON FOR VISIT:  1. Primary cancer of hepatic flexure of colon ([place])   2. Family history of colon cancer   3. Family history of breast cancer   4. Family history of lung cancer     I connected with Mr. Russi on 07/15/2019 at 11:00 AM EDT by Jackquline Denmark and verified that I am speaking with the correct person using two identifiers.    Patient location: home Provider location: [facility]  HISTORY OF PRESENT ILLNESS:   Mr. [place], a 34 y.o. male, was seen for a [place] cancer genetics consultation at the request of Dr. Benay Spice due to a personal and family history of cancer.  Mr. Hashimi presents to clinic today to discuss the possibility of a hereditary predisposition to cancer, genetic testing, and to further clarify his future cancer risks, as well as potential cancer risks for family members.   In 2020, at the age of 34, Mr. Ellena was diagnosed with adenocarcinoma of the colon. Tumor testing revealed normal MMR IHC.    CANCER HISTORY:  Oncology History   No history exists.    Past Medical History:  Diagnosis Date  . Abdominal pain   . Change in bowel habits   . Chronic back pain   . Depression   . Family history of breast cancer   . Family history of colon cancer   . Family history of colon cancer   . Family history of lung cancer   . Fatigue   . Headache(784.0)   . Migraine   . Muscle pain   . Nausea & vomiting   . Night sweats   . Rectal pain   . Urinary tract infection     Past Surgical History:  Procedure Laterality Date  . LAPAROSCOPIC RIGHT HEMI COLECTOMY Right 05/20/2019   Procedure: LAPAROSCOPIC RIGHT HEMI COLECTOMY;  Surgeon: Alphonsa Overall, MD;  Location: [facility];  Service: General;  Laterality: Right;    Social History   Socioeconomic History  . Marital status: Married    Spouse name: Not on file   . Number of children: Not on file  . Years of education: Not on file  . Highest education level: Not on file  Occupational History  . Not on file  Tobacco Use  . Smoking status: Never Smoker  . Smokeless tobacco: Never Used  Substance and Sexual Activity  . Alcohol use: Not Currently    Comment: recovering alchoholic  . Drug use: No  . Sexual activity: Yes  Other Topics Concern  . Not on file  Social History Narrative   ** Merged History Encounter **       Social Determinants of Health   Financial Resource Strain:   . Difficulty of Paying Living Expenses: Not on file  Food Insecurity:   . Worried About Charity fundraiser in the Last Year: Not on file  . Ran Out of Food in the Last Year: Not on file  Transportation Needs:   . Lack of Transportation (Medical): Not on file  . Lack of Transportation (Non-Medical): Not on file  Physical Activity:   . Days of Exercise per Week: Not on file  . Minutes of Exercise per Session: Not on file  Stress:   . Feeling of Stress : Not on file  Social Connections:   . Frequency of Communication with Friends and  Family: Not on file  . Frequency of Social Gatherings with Friends and Family: Not on file  . Attends Religious Services: Not on file  . Active Member of Clubs or Organizations: Not on file  . Attends Archivist Meetings: Not on file  . Marital Status: Not on file     FAMILY HISTORY:  We obtained a detailed, 4-generation family history.  Significant diagnoses are listed below: Family History  Problem Relation Age of Onset  . Alcohol abuse Mother   . Mental illness Mother   . Heart disease Mother        palpitations  . Alcohol abuse Father   . Heart disease Father 31       CAD  . Alcohol abuse Maternal Grandmother   . Breast cancer Maternal Grandmother        dx >50  . Alcohol abuse Maternal Grandfather   . Colon cancer Maternal Grandfather        dx >50  . Alcohol abuse Paternal Grandmother   . Alcohol  abuse Paternal Grandfather   . Lung cancer Paternal Grandfather   . Liver disease Other   . Kidney disease Other    Mr. Schafer has 2 daughters and 1 son, no cancers. Patient has 1 brother and 3 sisters, no cancers. No cancers in nieces/nephews.  Mr. Broker mother is living at 65 with no history of cancer, although he reports that he and his family suspect she may have cancer but she does not want to get checked out. Patient has 3 maternal uncles and 1 maternal aunt and reports limited knowledge about these individuals and maternal cousins, but no cancers he is aware of. His maternal grandmother had breast cancer, unsure exact age of diagnosis but over age 50. Maternal grandfather had colon cancer, unsure exact age of diagnosis, but older than 68. Both maternal grandparents are deceased.  Mr. Pfannenstiel father is living at 66 with no history of cancer. Patient has 2 paternal uncles and 2 paternal aunts, no cancers. No known cancers in cousins. His paternal grandmother is living at 33. Paternal grandfather is deceased and had lung cancer, no history of smoking.  Mr. Vittorio is unaware of previous family history of genetic testing for hereditary cancer risks. Patient's ancestors are of [place] and [place] descent. There is no reported Ashkenazi Jewish ancestry. There is no known consanguinity.  GENETIC COUNSELING ASSESSMENT: Mr. Carline is a 34 y.o. male with a personal and family history which is somewhat suggestive of a hereditary cancer syndrome and predisposition to cancer. We, therefore, discussed and recommended the following at today's visit.   DISCUSSION: We discussed that 5 - 10% of cancer is hereditary. There are genes that can be associated with hereditary colon cancer syndromes, and genes associated with other cancers as well. We discussed that testing is beneficial for several reasons including knowing how to follow individuals after completing their treatment, and understand if other family members  could be at risk for cancer and allow them to undergo genetic testing.   We reviewed the characteristics, features and inheritance patterns of hereditary cancer syndromes. We also discussed genetic testing, including the appropriate family members to test, the process of testing, insurance coverage and turn-around-time for results. We discussed the implications of a negative, positive and/or variant of uncertain significant result. We recommended Mr. Walthall pursue genetic testing for the [facility] Common Hereditary Cancers gene panel.   The Common Hereditary Cancers Panel offered by [facility] includes sequencing and/or deletion duplication testing  of the following 48 genes: APC, ATM, AXIN2, BARD1, BMPR1A, BRCA1, BRCA2, BRIP1, CDH1, CDKN2A (p14ARF), CDKN2A (p16INK4a), CKD4, CHEK2, CTNNA1, DICER1, EPCAM (Deletion/duplication testing only), GREM1 (promoter region deletion/duplication testing only), KIT, MEN1, MLH1, MSH2, MSH3, MSH6, MUTYH, NBN, NF1, NHTL1, PALB2, PDGFRA, PMS2, POLD1, POLE, PTEN, RAD50, RAD51C, RAD51D, RNF43, SDHB, SDHC, SDHD, SMAD4, SMARCA4. STK11, TP53, TSC1, TSC2, and VHL.  The following genes were evaluated for sequence changes only: SDHA and HOXB13 c.251G>A variant only.  Based on Mr. Keysor personal and family history of cancer, he meets medical criteria for genetic testing. Despite that he meets criteria, he may still have an out of pocket cost.   PLAN: After considering the risks, benefits, and limitations, Mr. Coad provided informed consent to pursue genetic testing. A saliva sample will be mailed to his home and he will send his sample to [facility] for analysis of the Common Hereditary Cancers Panel. Results should be available within approximately 2-3 weeks' time, at which point they will be disclosed by telephone to Mr. Siever, as will any additional recommendations warranted by these results. Mr. Veney will receive a summary of his genetic counseling visit and a copy of his  results once available. This information will also be available in Epic.   Mr. Mcbrien questions were answered to his satisfaction today. Our contact information was provided should additional questions or concerns arise. Thank you for the referral and allowing [place] to share in the care of your patient.   Faith Rogue, MS, [facility] Genetic Counselor [email or web address] Phone: [phone]  The patient was seen for a total of 30 minutes in virtual genetic counseling. [employer] Intern Kary Kos was also present and assisted with this case. Drs. Magrinat, Lindi Adie and/or Burr Medico were available for discussion regarding this case.   _______________________________________________________________________ For Office Staff:  Number of people involved in session: 2 Was an Intern/ student involved with case: yes

## 2019-07-28 ENCOUNTER — Encounter: Payer: Self-pay | Admitting: Oncology

## 2019-08-06 ENCOUNTER — Other Ambulatory Visit: Payer: Self-pay

## 2019-08-06 ENCOUNTER — Ambulatory Visit (HOSPITAL_COMMUNITY)
Admission: RE | Admit: 2019-08-06 | Discharge: 2019-08-06 | Disposition: A | Discharge status: Home / Self Care | Payer: 59 | Source: Ambulatory Visit | Attending: Oncology | Admitting: Oncology

## 2019-08-06 ENCOUNTER — Encounter (HOSPITAL_COMMUNITY): Payer: Self-pay

## 2019-08-06 DIAGNOSIS — C183 Malignant neoplasm of hepatic flexure: Secondary | ICD-10-CM

## 2019-08-06 MED ORDER — GADOBUTROL 1 MMOL/ML IV SOLN
10.0000 mL | Freq: Once | INTRAVENOUS | Status: AC | PRN
  Administered 2019-08-06: 10 mL via INTRAVENOUS

## 2019-08-09 ENCOUNTER — Telehealth: Payer: Self-pay | Admitting: *Deleted

## 2019-08-09 NOTE — Telephone Encounter (Signed)
Per Dr. Benay Spice, called to make pt aware that MRI shows cysts in the liver but no evidence of cancer. Advised to f/u as scheduled. Pt verbalized understanding.

## 2019-08-09 NOTE — Telephone Encounter (Signed)
-----   Message from Ladell Pier, MD sent at 08/06/2019  4:47 PM EST ----- Please call patient MRI shows cysts in the liver, no evidence of cancer, follow-up as scheduled

## 2019-09-08 ENCOUNTER — Encounter: Payer: Self-pay | Admitting: Licensed Clinical Social Worker

## 2019-09-24 NOTE — Progress Notes (Signed)
Mark Hopkins  [phone] [phone]) [phone]  ID: Mark Hopkins OB: 24-Jun-1986  MR#: [id number]  VZD#:[id number]  Patient Care Team: Etter Sjogren as PCP - General (Physician Assistant) Irene Shipper, MD as Consulting Physician (Gastroenterology)  CHIEF COMPLAINT: Stage I colon cancer.  INTERVAL HISTORY: Patient is a 34 year old male who was diagnosed with a stage I colon cancer on May 20, 2019 at the time of a partial colectomy.  Preop CEA was only mildly elevated at 3.1.  Staging CT scan revealed questionable lesions in his liver, but MRI on August 06, 2019 confirmed these to be benign cysts and not metastatic disease.  Patient reports occasional abdominal pain, but otherwise feels well.  He has no neurologic complaints.  He denies any recent fevers or illnesses.  He has a good appetite and denies weight loss.  He has no chest pain, shortness of breath, cough, or hemoptysis.  He denies any nausea, vomiting, constipation, or diarrhea.  He denies any melena or hematochezia.  He has no changes in his bowel movements.  He has no urinary complaints.  Patient offers no further specific complaints today.  REVIEW OF SYSTEMS:   Review of Systems  Constitutional: Negative.  Negative for fever, malaise/fatigue and weight loss.  Respiratory: Negative.  Negative for cough, hemoptysis and shortness of breath.   Cardiovascular: Negative.  Negative for chest pain and leg swelling.  Gastrointestinal: Positive for abdominal pain. Negative for blood in stool, constipation, diarrhea, nausea and vomiting.  Genitourinary: Negative.  Negative for dysuria.  Musculoskeletal: Negative.  Negative for back pain.  Skin: Negative.  Negative for rash.  Neurological: Negative.  Negative for dizziness, focal weakness, weakness and headaches.  Psychiatric/Behavioral: Negative.  The patient is not nervous/anxious.     As per HPI. Otherwise, a complete review of  systems is negative.  PAST MEDICAL HISTORY: Past Medical History:  Diagnosis Date  . Abdominal pain   . Change in bowel habits   . Chronic back pain   . Depression   . Family history of breast cancer   . Family history of colon cancer   . Family history of colon cancer   . Family history of lung cancer   . Fatigue   . Headache(784.0)   . Migraine   . Muscle pain   . Nausea & vomiting   . Night sweats   . Rectal pain   . Urinary tract infection     PAST SURGICAL HISTORY: Past Surgical History:  Procedure Laterality Date  . LAPAROSCOPIC RIGHT HEMI COLECTOMY Right 05/20/2019   Procedure: LAPAROSCOPIC RIGHT HEMI COLECTOMY;  Surgeon: Alphonsa Overall, MD;  Location: [facility];  Service: General;  Laterality: Right;    FAMILY HISTORY: Family History  Problem Relation Age of Onset  . Alcohol abuse Mother   . Mental illness Mother   . Heart disease Mother        palpitations  . Alcohol abuse Father   . Heart disease Father 64       CAD  . Alcohol abuse Maternal Grandmother   . Breast cancer Maternal Grandmother        dx >50  . Alcohol abuse Maternal Grandfather   . Colon cancer Maternal Grandfather        dx >50  . Alcohol abuse Paternal Grandmother   . Alcohol abuse Paternal Grandfather   . Lung cancer Paternal Grandfather   . Liver disease Other   . Kidney disease Other  ADVANCED DIRECTIVES (Y/N):  N  HEALTH MAINTENANCE: Social History   Tobacco Use  . Smoking status: Never Smoker  . Smokeless tobacco: Never Used  Substance Use Topics  . Alcohol use: Not Currently    Comment: recovering alchoholic  . Drug use: No     Colonoscopy:  PAP:  Bone density:  Lipid panel:  Allergies  Allergen Reactions  . Chlorhexidine     Current Outpatient Medications  Medication Sig Dispense Refill  . acetaminophen (TYLENOL) 500 MG tablet Take 1,000 mg by mouth every 6 (six) hours as needed for moderate pain.     . Calcium Carbonate Antacid (ALKA-SELTZER ANTACID PO)  Take 1 tablet by mouth daily as needed (heartburn).    . ondansetron (ZOFRAN ODT) 8 MG disintegrating tablet Take 1 tablet (8 mg total) by mouth every 8 (eight) hours as needed for nausea. 20 tablet 0   No current facility-administered medications for this visit.    OBJECTIVE: Vitals:   09/27/19 1108  BP: (!) 150/94  Pulse: 93  Temp: 97.6 F (36.4 C)  SpO2: 98%     Body mass index is 26.37 kg/m.    ECOG FS:0 - Asymptomatic  General: Well-developed, well-nourished, no acute distress. Eyes: Pink conjunctiva, anicteric sclera. HEENT: Normocephalic, moist mucous membranes. Lungs: No audible wheezing or coughing. Heart: Regular rate and rhythm. Abdomen: Soft, nontender, no obvious distention. Musculoskeletal: No edema, cyanosis, or clubbing. Neuro: Alert, answering all questions appropriately. Cranial nerves grossly intact. Skin: No rashes or petechiae noted. Psych: Normal affect. Lymphatics: No cervical, calvicular, axillary or inguinal LAD.   LAB RESULTS:  Lab Results  Component Value Date   NA 138 09/27/2019   K 3.7 09/27/2019   CL 104 09/27/2019   CO2 24 09/27/2019   GLUCOSE 96 09/27/2019   BUN 10 09/27/2019   CREATININE 0.91 09/27/2019   CALCIUM 9.3 09/27/2019   PROT 8.3 (H) 09/27/2019   ALBUMIN 4.4 09/27/2019   AST 21 09/27/2019   ALT 25 09/27/2019   ALKPHOS 76 09/27/2019   BILITOT 0.5 09/27/2019   GFRNONAA >60 09/27/2019   GFRAA >60 09/27/2019    Lab Results  Component Value Date   WBC 8.6 09/27/2019   NEUTROABS 6.0 09/27/2019   HGB 14.1 09/27/2019   HCT 43.7 09/27/2019   MCV 78.9 (L) 09/27/2019   PLT 387 09/27/2019     STUDIES: No results found.  ASSESSMENT: Stage I colon cancer  PLAN:    1.  Stage I colon cancer: Diagnosed on May 20, 2019 at the time of a partial colectomy.  0 of 17 lymph nodes positive for disease.  MSI stable.  Preop CEA was only mildly elevated at 3.1, today's result is pending.  Staging CT scan revealed questionable  lesions in his liver, but MRI on August 06, 2019 confirmed these to be benign cysts and not metastatic disease.  Patient has repeat colonoscopy scheduled for June of this year.  He has been seen by genetic counseling, but only completed his buccal swab last week, therefore results are pending at time of dictation.  No intervention is needed at this time.  Patient states he wishes to transfer care to [facility], therefore return to clinic in 6 months with repeat laboratory work and video assisted telemedicine visit. 2.  Abdominal pain: Likely secondary to adhesions.  Comprehensive metabolic panel and amylase are within normal limits.  Lipase pending at time of dictation.  I spent a total of 45 minutes reviewing chart data, face-to-face evaluation with the  patient, counseling and coordination of care as detailed above.  Patient expressed understanding and was in agreement with this plan. He also understands that He can call clinic at any time with any questions, concerns, or complaints.   Cancer Staging Primary cancer of hepatic flexure of colon [facility]) Staging form: Colon and Rectum, AJCC 8th Edition - Clinical stage from 09/27/2019: Stage I (cT2, cN0, cM0) - Signed by Lloyd Huger, MD on 09/27/2019   Lloyd Huger, MD   09/27/2019 1:02 PM

## 2019-09-27 ENCOUNTER — Other Ambulatory Visit: Payer: Self-pay

## 2019-09-27 ENCOUNTER — Inpatient Hospital Stay: Payer: 59 | Attending: Oncology | Admitting: Oncology

## 2019-09-27 ENCOUNTER — Encounter: Payer: Self-pay | Admitting: Oncology

## 2019-09-27 ENCOUNTER — Inpatient Hospital Stay: Payer: 59

## 2019-09-27 VITALS — BP 150/94 | HR 93 | Temp 97.6°F | Wt 199.9 lb

## 2019-09-27 DIAGNOSIS — Z803 Family history of malignant neoplasm of breast: Secondary | ICD-10-CM | POA: Insufficient documentation

## 2019-09-27 DIAGNOSIS — C183 Malignant neoplasm of hepatic flexure: Secondary | ICD-10-CM | POA: Diagnosis present

## 2019-09-27 DIAGNOSIS — Z79899 Other long term (current) drug therapy: Secondary | ICD-10-CM | POA: Diagnosis not present

## 2019-09-27 DIAGNOSIS — R109 Unspecified abdominal pain: Secondary | ICD-10-CM | POA: Insufficient documentation

## 2019-09-27 DIAGNOSIS — Z800 Family history of malignant neoplasm of digestive organs: Secondary | ICD-10-CM | POA: Insufficient documentation

## 2019-09-27 LAB — COMPREHENSIVE METABOLIC PANEL
ALT: 25 U/L (ref 0–44)
AST: 21 U/L (ref 15–41)
Albumin: 4.4 g/dL (ref 3.5–5.0)
Alkaline Phosphatase: 76 U/L (ref 38–126)
Anion gap: 10 (ref 5–15)
BUN: 10 mg/dL (ref 6–20)
CO2: 24 mmol/L (ref 22–32)
Calcium: 9.3 mg/dL (ref 8.9–10.3)
Chloride: 104 mmol/L (ref 98–111)
Creatinine, Ser: 0.91 mg/dL (ref 0.61–1.24)
GFR calc Af Amer: >60 mL/min (ref >60)
GFR calc non Af Amer: >60 mL/min (ref >60)
Glucose, Bld: 96 mg/dL (ref 70–99)
Potassium: 3.7 mmol/L (ref 3.5–5.1)
Sodium: 138 mmol/L (ref 135–145)
Total Bilirubin: 0.5 mg/dL (ref 0.3–1.2)
Total Protein: 8.3 g/dL — ABNORMAL HIGH (ref 6.5–8.1)

## 2019-09-27 LAB — CBC WITH DIFFERENTIAL/PLATELET
Abs Immature Granulocytes: 0.02 10*3/uL (ref 0.00–0.07)
Basophils Absolute: 0.1 10*3/uL (ref 0.0–0.1)
Basophils Relative: 1 %
Eosinophils Absolute: 0.2 10*3/uL (ref 0.0–0.5)
Eosinophils Relative: 2 %
HCT: 43.7 % (ref 39.0–52.0)
Hemoglobin: 14.1 g/dL (ref 13.0–17.0)
Immature Granulocytes: 0 %
Lymphocytes Relative: 19 %
Lymphs Abs: 1.6 10*3/uL (ref 0.7–4.0)
MCH: 25.5 pg — ABNORMAL LOW (ref 26.0–34.0)
MCHC: 32.3 g/dL (ref 30.0–36.0)
MCV: 78.9 fL — ABNORMAL LOW (ref 80.0–100.0)
Monocytes Absolute: 0.8 10*3/uL (ref 0.1–1.0)
Monocytes Relative: 9 %
Neutro Abs: 6 10*3/uL (ref 1.7–7.7)
Neutrophils Relative %: 69 %
Platelets: 387 10*3/uL (ref 150–400)
RBC: 5.54 MIL/uL (ref 4.22–5.81)
RDW: 16.1 % — ABNORMAL HIGH (ref 11.5–15.5)
WBC: 8.6 10*3/uL (ref 4.0–10.5)
nRBC: 0 % (ref 0.0–0.2)

## 2019-09-27 LAB — AMYLASE: Amylase: 54 U/L (ref 28–100)

## 2019-09-27 NOTE — Progress Notes (Signed)
Patient has concern about his liver. States having some pain and tightness in area. Last Sunday, tried drinking and experienced lots of pain.   Patient has bowel issues and was not sure what he could taking.

## 2019-09-28 LAB — CEA: CEA: 0.5 ng/mL (ref 0.0–4.7)

## 2019-10-29 ENCOUNTER — Emergency Department: Payer: 59

## 2019-10-29 ENCOUNTER — Other Ambulatory Visit: Payer: Self-pay

## 2019-10-29 ENCOUNTER — Emergency Department
Admission: EM | Admit: 2019-10-29 | Discharge: 2019-10-29 | Disposition: A | Discharge status: Home / Self Care | Payer: 59 | Attending: Emergency Medicine | Admitting: Emergency Medicine

## 2019-10-29 ENCOUNTER — Encounter: Payer: Self-pay | Admitting: Emergency Medicine

## 2019-10-29 DIAGNOSIS — R0602 Shortness of breath: Secondary | ICD-10-CM | POA: Diagnosis not present

## 2019-10-29 DIAGNOSIS — Z85038 Personal history of other malignant neoplasm of large intestine: Secondary | ICD-10-CM | POA: Insufficient documentation

## 2019-10-29 DIAGNOSIS — Z79899 Other long term (current) drug therapy: Secondary | ICD-10-CM | POA: Insufficient documentation

## 2019-10-29 DIAGNOSIS — R110 Nausea: Secondary | ICD-10-CM | POA: Diagnosis not present

## 2019-10-29 DIAGNOSIS — R0789 Other chest pain: Secondary | ICD-10-CM | POA: Insufficient documentation

## 2019-10-29 LAB — BASIC METABOLIC PANEL
Anion gap: 9 (ref 5–15)
BUN: 14 mg/dL (ref 6–20)
CO2: 26 mmol/L (ref 22–32)
Calcium: 9.7 mg/dL (ref 8.9–10.3)
Chloride: 106 mmol/L (ref 98–111)
Creatinine, Ser: 0.91 mg/dL (ref 0.61–1.24)
GFR calc Af Amer: >60 mL/min (ref >60)
GFR calc non Af Amer: >60 mL/min (ref >60)
Glucose, Bld: 89 mg/dL (ref 70–99)
Potassium: 4 mmol/L (ref 3.5–5.1)
Sodium: 141 mmol/L (ref 135–145)

## 2019-10-29 LAB — CBC
HCT: 44.7 % (ref 39.0–52.0)
Hemoglobin: 13.9 g/dL (ref 13.0–17.0)
MCH: 25.3 pg — ABNORMAL LOW (ref 26.0–34.0)
MCHC: 31.1 g/dL (ref 30.0–36.0)
MCV: 81.4 fL (ref 80.0–100.0)
Platelets: 429 10*3/uL — ABNORMAL HIGH (ref 150–400)
RBC: 5.49 MIL/uL (ref 4.22–5.81)
RDW: 16.1 % — ABNORMAL HIGH (ref 11.5–15.5)
WBC: 7.6 10*3/uL (ref 4.0–10.5)
nRBC: 0 % (ref 0.0–0.2)

## 2019-10-29 LAB — TROPONIN I (HIGH SENSITIVITY)
Troponin I (High Sensitivity): 3 ng/L (ref <18)
Troponin I (High Sensitivity): <2 ng/L (ref <18)

## 2019-10-29 NOTE — ED Triage Notes (Signed)
Pt reports last Friday night he started with burning cp to his left chest radiating into his left shoulder and some nausea and SOB. Pt reports he felt better the next day but then the pain came back and he is concerned for his heart.

## 2019-10-29 NOTE — ED Provider Notes (Signed)
[facility] Emergency Department Provider Note   ____________________________________________   First MD Initiated Contact with Patient 10/29/19 1403     (approximate)  I have reviewed the triage vital signs and the nursing notes.   HISTORY  Chief Complaint Chest Pain, Shortness of Breath, and Nausea    HPI Mark Hopkins is a 34 y.o. male with past medical history of colon cancer status post hemicolectomy, migraines, and depression who presents to the ED complaining of chest pain.  Patient reports that he has 2 separate episodes of chest pain, the first 1 occurring 1 week ago and another episode again today.  He describes the pain as burning to the area of his left chest and radiating into his left shoulder.  It is associated with some nausea and shortness of breath, but he denies any fevers or cough.  He has not had any sick contacts and denies any vomiting or diarrhea.  Chest pain and shortness of breath have resolved following his arrival in the ED.        Past Medical History:  Diagnosis Date  . Abdominal pain   . Change in bowel habits   . Chronic back pain   . Depression   . Family history of breast cancer   . Family history of colon cancer   . Family history of colon cancer   . Family history of lung cancer   . Fatigue   . Headache(784.0)   . Migraine   . Muscle pain   . Nausea & vomiting   . Night sweats   . Rectal pain   . Urinary tract infection     Patient Active Problem List   Diagnosis Date Noted  . Family history of colon cancer   . Family history of breast cancer   . Family history of lung cancer   . Primary cancer of hepatic flexure of colon ([place]) 05/20/2019  . Diarrhea 03/12/2019  . Rectal bleeding 03/12/2019  . Generalized abdominal pain 03/12/2019  . Liver lesion 03/12/2019  . Abnormal CT scan, colon 03/12/2019  . Migraine 10/23/2011  . History of depression 10/23/2011    Past Surgical History:  Procedure  Laterality Date  . LAPAROSCOPIC RIGHT HEMI COLECTOMY Right 05/20/2019   Procedure: LAPAROSCOPIC RIGHT HEMI COLECTOMY;  Surgeon: Alphonsa Overall, MD;  Location: [facility];  Service: General;  Laterality: Right;    Prior to Admission medications   Medication Sig Start Date End Date Taking? Authorizing Provider  acetaminophen (TYLENOL) 500 MG tablet Take 1,000 mg by mouth every 6 (six) hours as needed for moderate pain.     [provider]  Calcium Carbonate Antacid (ALKA-SELTZER ANTACID PO) Take 1 tablet by mouth daily as needed (heartburn).    [provider]  ondansetron (ZOFRAN ODT) 8 MG disintegrating tablet Take 1 tablet (8 mg total) by mouth every 8 (eight) hours as needed for nausea. 06/12/18   Varney Biles, MD    Allergies Chlorhexidine  Family History  Problem Relation Age of Onset  . Alcohol abuse Mother   . Mental illness Mother   . Heart disease Mother        palpitations  . Alcohol abuse Father   . Heart disease Father 31       CAD  . Alcohol abuse Maternal Grandmother   . Breast cancer Maternal Grandmother        dx >50  . Alcohol abuse Maternal Grandfather   . Colon cancer Maternal Grandfather  dx >50  . Alcohol abuse Paternal Grandmother   . Alcohol abuse Paternal Grandfather   . Lung cancer Paternal Grandfather   . Liver disease Other   . Kidney disease Other     Social History Social History   Tobacco Use  . Smoking status: Never Smoker  . Smokeless tobacco: Never Used  Substance Use Topics  . Alcohol use: Not Currently    Comment: recovering alchoholic  . Drug use: No    Review of Systems  Constitutional: No fever/chills Eyes: No visual changes. ENT: No sore throat. Cardiovascular: Positive for chest pain. Respiratory: Positive for shortness of breath. Gastrointestinal: No abdominal pain.  No nausea, no vomiting.  No diarrhea.  No constipation. Genitourinary: Negative for dysuria. Musculoskeletal: Negative for back  pain. Skin: Negative for rash. Neurological: Negative for headaches, focal weakness or numbness.  ____________________________________________   PHYSICAL EXAM:  VITAL SIGNS: ED Triage Vitals  Enc Vitals Group     BP 10/29/19 1049 (!) 162/97     Pulse Rate 10/29/19 1049 82     Resp 10/29/19 1049 20     Temp 10/29/19 1051 98.3 F (36.8 C)     Temp Source 10/29/19 1051 Oral     SpO2 10/29/19 1049 99 %     Weight 10/29/19 1049 200 lb (90.7 kg)     Height 10/29/19 1049 6' (1.829 m)     Head Circumference --      Peak Flow --      Pain Score 10/29/19 1049 2     Pain Loc --      Pain Edu? --      Excl. in [place]? --     Constitutional: Alert and oriented. Eyes: Conjunctivae are normal. Head: Atraumatic. Nose: No congestion/rhinnorhea. Mouth/Throat: Mucous membranes are moist. Neck: Normal ROM Cardiovascular: Normal rate, regular rhythm. Grossly normal heart sounds. Respiratory: Normal respiratory effort.  No retractions. Lungs CTAB. Gastrointestinal: Soft and nontender. No distention. Genitourinary: deferred Musculoskeletal: No lower extremity tenderness nor edema. Neurologic:  Normal speech and language. No gross focal neurologic deficits are appreciated. Skin:  Skin is warm, dry and intact. No rash noted. Psychiatric: Mood and affect are normal. Speech and behavior are normal.  ____________________________________________   LABS (all labs ordered are listed, but only abnormal results are displayed)  Labs Reviewed  CBC - Abnormal; Notable for the following components:      Result Value   MCH 25.3 (*)    RDW 16.1 (*)    Platelets 429 (*)    All other components within normal limits  BASIC METABOLIC PANEL  TROPONIN I (HIGH SENSITIVITY)  TROPONIN I (HIGH SENSITIVITY)   ____________________________________________  EKG  ED ECG REPORT I, Blake Divine, the attending physician, personally viewed and interpreted this ECG.   Date: 10/29/2019  EKG Time: 10:43  Rate:  78  Rhythm: normal sinus rhythm  Axis: Normal  Intervals:none  ST&T Change: None   PROCEDURES  Procedure(s) performed (including Critical Care):  .1-3 Lead EKG Interpretation Performed by: Blake Divine, MD Authorized by: Blake Divine, MD     Interpretation: normal     ECG rate:  78   ECG rate assessment: normal     Rhythm: sinus rhythm     Ectopy: none     Conduction: normal       ____________________________________________   INITIAL IMPRESSION / ASSESSMENT AND PLAN / ED COURSE       34 year old male with past medical history of colon cancer status post hemicolectomy  presents to the ED complaining of 2 separate episodes of left-sided burning chest pain as well as some difficulty catching his breath, both episodes have self resolved.  EKG shows no evidence of arrhythmia or ischemia and 2 sets of troponin are negative.  Given his heart score of less than 4, I doubt ACS.  Chest x-ray is also negative for acute process, and given he is asymptomatic with reassuring vital signs at this time, I doubt PE.  Remainder of labs are unremarkable and at this point he is appropriate for discharge home.  I have counseled him to follow-up with his PCP and otherwise return to the ED for new or worsening symptoms, patient agrees with plan.      ____________________________________________   FINAL CLINICAL IMPRESSION(S) / ED DIAGNOSES  Final diagnoses:  Atypical chest pain     ED Discharge Orders    None       Note:  This document was prepared using Dragon voice recognition software and may include unintentional dictation errors.   Blake Divine, MD 10/29/19 1515

## 2019-12-28 ENCOUNTER — Inpatient Hospital Stay: Payer: 59

## 2019-12-28 ENCOUNTER — Inpatient Hospital Stay: Payer: 59 | Attending: Oncology | Admitting: Oncology

## 2019-12-28 ENCOUNTER — Other Ambulatory Visit: Payer: Self-pay

## 2019-12-28 ENCOUNTER — Telehealth: Payer: Self-pay | Admitting: *Deleted

## 2019-12-28 VITALS — BP 148/98 | HR 86 | Temp 97.7°F | Resp 16 | Ht 72.0 in | Wt 204.9 lb

## 2019-12-28 DIAGNOSIS — C183 Malignant neoplasm of hepatic flexure: Secondary | ICD-10-CM

## 2019-12-28 DIAGNOSIS — K7689 Other specified diseases of liver: Secondary | ICD-10-CM | POA: Insufficient documentation

## 2019-12-28 DIAGNOSIS — Z85038 Personal history of other malignant neoplasm of large intestine: Secondary | ICD-10-CM | POA: Diagnosis present

## 2019-12-28 LAB — CEA (IN HOUSE-CHCC): CEA (CHCC-In House): <1 ng/mL (ref 0.00–5.00)

## 2019-12-28 NOTE — Progress Notes (Signed)
  [facility] OFFICE PROGRESS NOTE   Diagnosis: Colon cancer  INTERVAL HISTORY:   Mark Hopkins returns for a scheduled visit.  He generally feels well.  He complains of frequent bowel movements.  He has loose stool anytime he eats.  This occurs with all types of foods.  He recently had a "black "stool. He has been evaluated by Dr. Jose Persia at [place].  He underwent an upper and lower endoscopy on 12/15/2019. He underwent biopsies of the stomach and duodenum to rule out H. pylori and celiac disease.  A polyp was removed from the transverse colon.  He underwent an MRI of the liver on 08/06/2019 to follow-up on indeterminate liver lesions.  A few tiny cysts were noted.  No hepatic mass. Objective:  Vital signs in last 24 hours:  Blood pressure (!) 148/98, pulse 86, temperature 97.7 F (36.5 C), temperature source Temporal, resp. rate 16, height 6' (1.829 m), weight 204 lb 14.4 oz (92.9 kg), SpO2 100 %.    Lymphatics: No cervical, supraclavicular, axillary, or inguinal nodes Resp: Lungs clear bilaterally Cardio: Regular rate and rhythm GI: No hepatosplenomegaly, no mass, nontender Vascular: No leg edema  Lab Results:  Lab Results  Component Value Date   WBC 7.6 10/29/2019   HGB 13.9 10/29/2019   HCT 44.7 10/29/2019   MCV 81.4 10/29/2019   PLT 429 (H) 10/29/2019   NEUTROABS 6.0 09/27/2019    CMP  Lab Results  Component Value Date   NA 141 10/29/2019   K 4.0 10/29/2019   CL 106 10/29/2019   CO2 26 10/29/2019   GLUCOSE 89 10/29/2019   BUN 14 10/29/2019   CREATININE 0.91 10/29/2019   CALCIUM 9.7 10/29/2019   PROT 8.3 (H) 09/27/2019   ALBUMIN 4.4 09/27/2019   AST 21 09/27/2019   ALT 25 09/27/2019   ALKPHOS 76 09/27/2019   BILITOT 0.5 09/27/2019   GFRNONAA >60 10/29/2019   GFRAA >60 10/29/2019    Lab Results  Component Value Date   CEA1 0.5 09/27/2019    Medications: I have reviewed the patient's current medications.   Assessment/Plan: 1. 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 Family history of multiple cancers, referred to the genetics counselor 3. Diarrhea-evaluated by gastroenterology    Disposition: Mark Hopkins is in clinical remission from colon cancer.  The etiology of his diarrhea is unclear.  He will continue follow-up with gastroenterology.  He has seen the genetics counselor.  We will be sure genetic testing was obtained.  We will follow up on the CEA from today.  He will return for an office visit and CEA in 6 months.  Betsy Coder, MD  12/28/2019  1:37 PM

## 2019-12-28 NOTE — Telephone Encounter (Signed)
At request of Dr. Benay Spice, left VM asking if he ever turned in the saliva test for genetics that was provided several months ago and if wishes to continue his oncology care here or at [facility]?

## 2019-12-30 ENCOUNTER — Telehealth: Payer: Self-pay | Admitting: Oncology

## 2019-12-30 NOTE — Telephone Encounter (Signed)
Scheduled appts per 6/22 los. Left voicemail with appt date and time.

## 2020-01-05 NOTE — Telephone Encounter (Signed)
Called and left message again regarding saliva test and where he wishes to continue his oncology f/u care.

## 2020-03-06 ENCOUNTER — Encounter: Payer: Self-pay | Admitting: Oncology

## 2020-03-07 ENCOUNTER — Other Ambulatory Visit (HOSPITAL_COMMUNITY): Payer: Self-pay | Admitting: Nurse Practitioner

## 2020-03-07 ENCOUNTER — Encounter: Payer: Self-pay | Admitting: Nurse Practitioner

## 2020-03-07 ENCOUNTER — Telehealth (HOSPITAL_COMMUNITY): Payer: Self-pay | Admitting: Nurse Practitioner

## 2020-03-07 DIAGNOSIS — U071 COVID-19: Secondary | ICD-10-CM

## 2020-03-07 NOTE — Progress Notes (Signed)
I connected by phone with Mark Hopkins on 03/07/2020 at 12:17 PM to discuss the potential use of an new treatment for mild to moderate COVID-19 viral infection in non-hospitalized patients.  This patient is a 34 y.o. male that meets the FDA criteria for Emergency Use Authorization of casirivimab\imdevimab.  Has a (+) direct SARS-CoV-2 viral test result  Has mild or moderate COVID-19   Is ? 32 years of age and weighs ? 40 kg  Is NOT hospitalized due to COVID-19  Is NOT requiring oxygen therapy or requiring an increase in baseline oxygen flow rate due to COVID-19  Is within 10 days of symptom onset  Has at least one of the high risk factor(s) for progression to severe COVID-19 and/or hospitalization as defined in EUA.  Specific high risk criteria : Immunosuppressive Disease or Treatment and Other high risk medical condition per CDC:  history of colon cancer, unvaccinated   I have spoken and communicated the following to the patient or parent/caregiver:  1. FDA has authorized the emergency use of casirivimab\imdevimab for the treatment of mild to moderate COVID-19 in adults and pediatric patients with positive results of direct SARS-CoV-2 viral testing who are 33 years of age and older weighing at least 40 kg, and who are at high risk for progressing to severe COVID-19 and/or hospitalization.  2. The significant known and potential risks and benefits of casirivimab\imdevimab, and the extent to which such potential risks and benefits are unknown.  3. Information on available alternative treatments and the risks and benefits of those alternatives, including clinical trials.  4. Patients treated with casirivimab\imdevimab should continue to self-isolate and use infection control measures (e.g., wear mask, isolate, social distance, avoid sharing personal items, clean and disinfect "high touch" surfaces, and frequent handwashing) according to CDC guidelines.   5. The patient or  parent/caregiver has the option to accept or refuse casirivimab\imdevimab .  After reviewing this information with the patient, The patient agreed to proceed with receiving casirivimab\imdevimab infusion and will be provided a copy of the Fact sheet prior to receiving the infusion.Beckey Rutter, [place], AGNP-C [phone] ([place])

## 2020-03-07 NOTE — Telephone Encounter (Signed)
Called to Discuss with patient about Covid symptoms and the use of regeneron, a monoclonal antibody infusion for those with mild to moderate Covid symptoms and at a high risk of hospitalization.     Pt is qualified for this infusion at the [facility] infusion center due to co-morbid conditions and/or a member of an at-risk group.     Unable to reach pt. Left message to return call. Sent mychart message.   Tamika Shropshire, DNP, AGNP-C [phone] (Infusion Center Hotline)  

## 2020-03-09 ENCOUNTER — Ambulatory Visit (HOSPITAL_COMMUNITY)
Admission: RE | Admit: 2020-03-09 | Discharge: 2020-03-09 | Disposition: A | Discharge status: Home / Self Care | Payer: 59 | Source: Ambulatory Visit | Attending: Pulmonary Disease | Admitting: Pulmonary Disease

## 2020-03-09 DIAGNOSIS — U071 COVID-19: Secondary | ICD-10-CM | POA: Insufficient documentation

## 2020-03-09 MED ORDER — FAMOTIDINE IN NACL 20-0.9 MG/50ML-% IV SOLN: 20.0000 mg | Freq: Once | INTRAVENOUS | Status: DC | PRN

## 2020-03-09 MED ORDER — DIPHENHYDRAMINE HCL 50 MG/ML IJ SOLN: 50.0000 mg | Freq: Once | INTRAMUSCULAR | Status: DC | PRN

## 2020-03-09 MED ORDER — ALBUTEROL SULFATE HFA 108 (90 BASE) MCG/ACT IN AERS: 2.0000 | INHALATION_SPRAY | Freq: Once | RESPIRATORY_TRACT | Status: DC | PRN

## 2020-03-09 MED ORDER — SODIUM CHLORIDE 0.9 % IV SOLN
1200.0000 mg | Freq: Once | INTRAVENOUS | Status: AC
  Administered 2020-03-09: 1200 mg via INTRAVENOUS

## 2020-03-09 MED ORDER — SODIUM CHLORIDE 0.9 % IV SOLN: INTRAVENOUS | Status: DC | PRN

## 2020-03-09 MED ORDER — METHYLPREDNISOLONE SODIUM SUCC 125 MG IJ SOLR: 125.0000 mg | Freq: Once | INTRAMUSCULAR | Status: DC | PRN

## 2020-03-09 MED ORDER — EPINEPHRINE 0.3 MG/0.3ML IJ SOAJ: 0.3000 mg | Freq: Once | INTRAMUSCULAR | Status: DC | PRN

## 2020-03-09 NOTE — Progress Notes (Signed)
  Diagnosis: COVID-19  Physician: Dr. Asencion Noble  Procedure: Covid Infusion Clinic Med: casirivimab\imdevimab infusion - Provided patient with casirivimab\imdevimab fact sheet for patients, parents and caregivers prior to infusion.  Complications: No immediate complications noted.  Discharge: Discharged home   Gaye Alken 03/09/2020

## 2020-03-09 NOTE — Progress Notes (Addendum)
Pt to infusion clinic for regeneron infusion, vitals taken prior to infusion, pt found to be hypertensive. Pt denies hx of diagnosed hypertension but he states that "my blood pressure fluctuates". NP at clinic, Wilber Bihari, NP notified and orders proceed with infusion at this time.

## 2020-03-09 NOTE — Progress Notes (Signed)
Pt offered Tylenol, declines and states that he will take at home.

## 2020-03-09 NOTE — Discharge Instructions (Signed)

## 2020-03-20 ENCOUNTER — Encounter: Payer: Self-pay | Admitting: Oncology

## 2020-03-26 NOTE — Progress Notes (Signed)
[place]  [phone] Fax:(336) [phone]  ID: Courtney Fenlon OB: [birth year]  MR#: [id number]  GSU#:[id number]  Patient Care Team: Etter Sjogren as PCP - General (Physician Assistant) Irene Shipper, MD as Consulting Physician (Gastroenterology)  I connected with Heloise Beecham on 03/31/20 at  1:00 PM EDT by video enabled telemedicine visit and verified that I am speaking with the correct person using two identifiers.   I discussed the limitations, risks, security and privacy concerns of performing an evaluation and management service by telemedicine and the availability of in-person appointments. I also discussed with the patient that there may be a patient responsible charge related to this service. The patient expressed understanding and agreed to proceed.   Other persons participating in the visit and their role in the encounter: Patient, MD.  Patient's location: Home. Provider's location: Clinic.  CHIEF COMPLAINT: Stage I colon cancer.  INTERVAL HISTORY: Patient agreed to video assisted telemedicine visit for repeat laboratory work and further evaluation.  He has been having some "GI issues" and continues follow-up with his gastroenterologist.  He reports a recent colonoscopy that was normal, but we do not have these results in hand.  He otherwise feels well.  He has no neurologic complaints.  He denies any recent fevers or illnesses.  He has a good appetite and denies weight loss.  He has no chest pain, shortness of breath, cough, or hemoptysis.  He denies any nausea, vomiting, constipation, or diarrhea.  He denies any melena or hematochezia.  He has no changes in his bowel movements.  He has no urinary complaints.  Patient offers no further specific complaints today.  REVIEW OF SYSTEMS:   Review of Systems  Constitutional: Negative.  Negative for fever, malaise/fatigue and weight loss.  Respiratory: Negative.  Negative for cough,  hemoptysis and shortness of breath.   Cardiovascular: Negative.  Negative for chest pain and leg swelling.  Gastrointestinal: Positive for abdominal pain. Negative for blood in stool, constipation, diarrhea, nausea and vomiting.  Genitourinary: Negative.  Negative for dysuria.  Musculoskeletal: Negative.  Negative for back pain.  Skin: Negative.  Negative for rash.  Neurological: Negative.  Negative for dizziness, focal weakness, weakness and headaches.  Psychiatric/Behavioral: Negative.  The patient is not nervous/anxious.     As per HPI. Otherwise, a complete review of systems is negative.  PAST MEDICAL HISTORY: Past Medical History:  Diagnosis Date  . Abdominal pain   . Change in bowel habits   . Chronic back pain   . Depression   . Family history of breast cancer   . Family history of colon cancer   . Family history of colon cancer   . Family history of lung cancer   . Fatigue   . Headache(784.0)   . Migraine   . Muscle pain   . Nausea & vomiting   . Night sweats   . Rectal pain   . Urinary tract infection     PAST SURGICAL HISTORY: Past Surgical History:  Procedure Laterality Date  . LAPAROSCOPIC RIGHT HEMI COLECTOMY Right 05/20/2019   Procedure: LAPAROSCOPIC RIGHT HEMI COLECTOMY;  Surgeon: Alphonsa Overall, MD;  Location: [facility];  Service: General;  Laterality: Right;    FAMILY HISTORY: Family History  Problem Relation Age of Onset  . Alcohol abuse Mother   . Mental illness Mother   . Heart disease Mother        palpitations  . Alcohol abuse Father   . Heart disease Father  59       CAD  . Alcohol abuse Maternal Grandmother   . Breast cancer Maternal Grandmother        dx >50  . Alcohol abuse Maternal Grandfather   . Colon cancer Maternal Grandfather        dx >50  . Alcohol abuse Paternal Grandmother   . Alcohol abuse Paternal Grandfather   . Lung cancer Paternal Grandfather   . Liver disease Other   . Kidney disease Other     ADVANCED DIRECTIVES  (Y/N):  N  HEALTH MAINTENANCE: Social History   Tobacco Use  . Smoking status: Never Smoker  . Smokeless tobacco: Never Used  Vaping Use  . Vaping Use: Never used  Substance Use Topics  . Alcohol use: Not Currently    Comment: recovering alchoholic  . Drug use: No     Colonoscopy:  PAP:  Bone density:  Lipid panel:  Allergies  Allergen Reactions  . Chlorhexidine     Current Outpatient Medications  Medication Sig Dispense Refill  . acetaminophen (TYLENOL) 500 MG tablet Take 1,000 mg by mouth every 6 (six) hours as needed for moderate pain.     . Calcium Carbonate Antacid (ALKA-SELTZER ANTACID PO) Take 1 tablet by mouth daily as needed (heartburn).    . ondansetron (ZOFRAN ODT) 8 MG disintegrating tablet Take 1 tablet (8 mg total) by mouth every 8 (eight) hours as needed for nausea. 20 tablet 0   No current facility-administered medications for this visit.    OBJECTIVE: There were no vitals filed for this visit.   There is no height or weight on file to calculate BMI.    ECOG FS:0 - Asymptomatic  General: Well-developed, well-nourished, no acute distress. HEENT: Normocephalic. Neuro: Alert, answering all questions appropriately. Cranial nerves grossly intact. Psych: Normal affect.  LAB RESULTS:  Lab Results  Component Value Date   NA 139 03/29/2020   K 3.9 03/29/2020   CL 108 03/29/2020   CO2 24 03/29/2020   GLUCOSE 96 03/29/2020   BUN 9 03/29/2020   CREATININE 0.78 03/29/2020   CALCIUM 8.8 (L) 03/29/2020   PROT 7.6 03/29/2020   ALBUMIN 3.9 03/29/2020   AST 19 03/29/2020   ALT 25 03/29/2020   ALKPHOS 63 03/29/2020   BILITOT 0.6 03/29/2020   GFRNONAA >60 03/29/2020   GFRAA >60 03/29/2020    Lab Results  Component Value Date   WBC 6.7 03/29/2020   NEUTROABS 4.6 03/29/2020   HGB 13.9 03/29/2020   HCT 41.6 03/29/2020   MCV 81.4 03/29/2020   PLT 343 03/29/2020     STUDIES: No results found.  ASSESSMENT: Stage I colon cancer  PLAN:    1.   Stage I colon cancer: Diagnosed on May 20, 2019 at the time of a partial colectomy.  0 of 17 lymph nodes positive for disease.  MSI stable.  Preop CEA was only mildly elevated at 3.1, but his most recent result was 0.6.  Staging CT scan revealed questionable lesions in his liver, but MRI on August 06, 2019 confirmed these to be benign cysts and not metastatic disease.  Patient reports repeat colonoscopy was within normal limits, but we do not have these results at this time.  He has been seen by genetic counseling, but results are unknown at this time.  No intervention is needed at this time.  Continue follow-up with GI as indicated.  Return to clinic in 1 year with repeat laboratory work and video assisted telemedicine visit.  2.  Abdominal pain: Possibly secondary to adhesions.  Continue follow-up with GI as above.  I provided 20 minutes of face-to-face video visit time during this encounter which included chart review, counseling, and coordination of care as documented above.   Patient expressed understanding and was in agreement with this plan. He also understands that He can call clinic at any time with any questions, concerns, or complaints.   Cancer Staging Primary cancer of hepatic flexure of colon [facility]) Staging form: Colon and Rectum, AJCC 8th Edition - Clinical stage from 09/27/2019: Stage I (cT2, cN0, cM0) - Signed by Lloyd Huger, MD on 09/27/2019   Lloyd Huger, MD   03/31/2020 4:13 PM

## 2020-03-28 ENCOUNTER — Other Ambulatory Visit: Payer: Self-pay | Admitting: *Deleted

## 2020-03-28 DIAGNOSIS — Z800 Family history of malignant neoplasm of digestive organs: Secondary | ICD-10-CM

## 2020-03-28 DIAGNOSIS — C183 Malignant neoplasm of hepatic flexure: Secondary | ICD-10-CM

## 2020-03-29 ENCOUNTER — Other Ambulatory Visit: Payer: Self-pay

## 2020-03-29 ENCOUNTER — Inpatient Hospital Stay: Payer: 59 | Attending: Oncology

## 2020-03-29 DIAGNOSIS — C183 Malignant neoplasm of hepatic flexure: Secondary | ICD-10-CM | POA: Insufficient documentation

## 2020-03-29 LAB — CBC WITH DIFFERENTIAL/PLATELET
Abs Immature Granulocytes: 0.02 10*3/uL (ref 0.00–0.07)
Basophils Absolute: 0 10*3/uL (ref 0.0–0.1)
Basophils Relative: 0 %
Eosinophils Absolute: 0.2 10*3/uL (ref 0.0–0.5)
Eosinophils Relative: 3 %
HCT: 41.6 % (ref 39.0–52.0)
Hemoglobin: 13.9 g/dL (ref 13.0–17.0)
Immature Granulocytes: 0 %
Lymphocytes Relative: 21 %
Lymphs Abs: 1.4 10*3/uL (ref 0.7–4.0)
MCH: 27.2 pg (ref 26.0–34.0)
MCHC: 33.4 g/dL (ref 30.0–36.0)
MCV: 81.4 fL (ref 80.0–100.0)
Monocytes Absolute: 0.6 10*3/uL (ref 0.1–1.0)
Monocytes Relative: 8 %
Neutro Abs: 4.6 10*3/uL (ref 1.7–7.7)
Neutrophils Relative %: 68 %
Platelets: 343 10*3/uL (ref 150–400)
RBC: 5.11 MIL/uL (ref 4.22–5.81)
RDW: 14.2 % (ref 11.5–15.5)
WBC: 6.7 10*3/uL (ref 4.0–10.5)
nRBC: 0 % (ref 0.0–0.2)

## 2020-03-29 LAB — COMPREHENSIVE METABOLIC PANEL
ALT: 25 U/L (ref 0–44)
AST: 19 U/L (ref 15–41)
Albumin: 3.9 g/dL (ref 3.5–5.0)
Alkaline Phosphatase: 63 U/L (ref 38–126)
Anion gap: 7 (ref 5–15)
BUN: 9 mg/dL (ref 6–20)
CO2: 24 mmol/L (ref 22–32)
Calcium: 8.8 mg/dL — ABNORMAL LOW (ref 8.9–10.3)
Chloride: 108 mmol/L (ref 98–111)
Creatinine, Ser: 0.78 mg/dL (ref 0.61–1.24)
GFR calc Af Amer: >60 mL/min (ref >60)
GFR calc non Af Amer: >60 mL/min (ref >60)
Glucose, Bld: 96 mg/dL (ref 70–99)
Potassium: 3.9 mmol/L (ref 3.5–5.1)
Sodium: 139 mmol/L (ref 135–145)
Total Bilirubin: 0.6 mg/dL (ref 0.3–1.2)
Total Protein: 7.6 g/dL (ref 6.5–8.1)

## 2020-03-30 LAB — CEA: CEA: 0.6 ng/mL (ref 0.0–4.7)

## 2020-03-31 ENCOUNTER — Inpatient Hospital Stay (HOSPITAL_BASED_OUTPATIENT_CLINIC_OR_DEPARTMENT_OTHER): Payer: 59 | Admitting: Oncology

## 2020-03-31 DIAGNOSIS — C183 Malignant neoplasm of hepatic flexure: Secondary | ICD-10-CM | POA: Diagnosis not present

## 2020-03-31 NOTE — Progress Notes (Signed)
Patient recently had COVID and is still recovering. Still reports some shortness of breath. He denies any pain today. Report frequent nausea and heartburn that keep patient awake. Currently nauseous. Also reports some red colored stool last night. Reports he has not noticed that before and would like to know if he should be concerned. Denies other concerns at this time.

## 2020-07-03 ENCOUNTER — Other Ambulatory Visit: Payer: 59

## 2020-07-14 ENCOUNTER — Encounter: Payer: Self-pay | Admitting: Oncology

## 2020-07-31 ENCOUNTER — Encounter: Payer: Self-pay | Admitting: Oncology

## 2021-03-22 ENCOUNTER — Inpatient Hospital Stay: Payer: BLUE CROSS/BLUE SHIELD | Attending: Oncology

## 2021-03-22 DIAGNOSIS — C183 Malignant neoplasm of hepatic flexure: Secondary | ICD-10-CM

## 2021-03-22 DIAGNOSIS — Z9049 Acquired absence of other specified parts of digestive tract: Secondary | ICD-10-CM | POA: Insufficient documentation

## 2021-03-22 DIAGNOSIS — Z85038 Personal history of other malignant neoplasm of large intestine: Secondary | ICD-10-CM | POA: Insufficient documentation

## 2021-03-22 DIAGNOSIS — Z79899 Other long term (current) drug therapy: Secondary | ICD-10-CM | POA: Insufficient documentation

## 2021-03-22 DIAGNOSIS — I10 Essential (primary) hypertension: Secondary | ICD-10-CM | POA: Diagnosis not present

## 2021-03-22 LAB — COMPREHENSIVE METABOLIC PANEL
ALT: 19 U/L (ref 0–44)
AST: 19 U/L (ref 15–41)
Albumin: 4.1 g/dL (ref 3.5–5.0)
Alkaline Phosphatase: 82 U/L (ref 38–126)
Anion gap: 11 (ref 5–15)
BUN: 9 mg/dL (ref 6–20)
CO2: 23 mmol/L (ref 22–32)
Calcium: 9 mg/dL (ref 8.9–10.3)
Chloride: 103 mmol/L (ref 98–111)
Creatinine, Ser: 0.82 mg/dL (ref 0.61–1.24)
GFR, Estimated: >60 mL/min (ref >60)
Glucose, Bld: 86 mg/dL (ref 70–99)
Potassium: 3.8 mmol/L (ref 3.5–5.1)
Sodium: 137 mmol/L (ref 135–145)
Total Bilirubin: 0.7 mg/dL (ref 0.3–1.2)
Total Protein: 7.9 g/dL (ref 6.5–8.1)

## 2021-03-22 LAB — CBC WITH DIFFERENTIAL/PLATELET
Abs Immature Granulocytes: 0.02 10*3/uL (ref 0.00–0.07)
Basophils Absolute: 0.1 10*3/uL (ref 0.0–0.1)
Basophils Relative: 1 %
Eosinophils Absolute: 0.7 10*3/uL — ABNORMAL HIGH (ref 0.0–0.5)
Eosinophils Relative: 7 %
HCT: 45.2 % (ref 39.0–52.0)
Hemoglobin: 15.3 g/dL (ref 13.0–17.0)
Immature Granulocytes: 0 %
Lymphocytes Relative: 20 %
Lymphs Abs: 2 10*3/uL (ref 0.7–4.0)
MCH: 29.1 pg (ref 26.0–34.0)
MCHC: 33.8 g/dL (ref 30.0–36.0)
MCV: 86.1 fL (ref 80.0–100.0)
Monocytes Absolute: 0.9 10*3/uL (ref 0.1–1.0)
Monocytes Relative: 10 %
Neutro Abs: 6.1 10*3/uL (ref 1.7–7.7)
Neutrophils Relative %: 62 %
Platelets: 327 10*3/uL (ref 150–400)
RBC: 5.25 MIL/uL (ref 4.22–5.81)
RDW: 13 % (ref 11.5–15.5)
WBC: 9.8 10*3/uL (ref 4.0–10.5)
nRBC: 0 % (ref 0.0–0.2)

## 2021-03-23 LAB — CEA: CEA: 0.4 ng/mL (ref 0.0–4.7)

## 2021-03-26 ENCOUNTER — Inpatient Hospital Stay (HOSPITAL_BASED_OUTPATIENT_CLINIC_OR_DEPARTMENT_OTHER): Payer: BLUE CROSS/BLUE SHIELD | Admitting: Oncology

## 2021-03-26 ENCOUNTER — Encounter: Payer: Self-pay | Admitting: Oncology

## 2021-03-26 DIAGNOSIS — C183 Malignant neoplasm of hepatic flexure: Secondary | ICD-10-CM

## 2021-03-26 NOTE — Progress Notes (Signed)
Patient denies new problems/concerns today.   °

## 2021-03-26 NOTE — Progress Notes (Signed)
[place]  [phone]) [phone] Fax:(336) [phone]  ID: Mark Hopkins OB: 27-Aug-1985  MR#: [id number]  MCN#:[id number]  Patient Care Team: Mark Hopkins as PCP - General (Physician Assistant) Mark Shipper, MD as Consulting Physician (Gastroenterology)  I connected with Mark Hopkins on 03/27/21 at  2:30 PM EDT by video enabled telemedicine visit and verified that I am speaking with the correct person using two identifiers.   I discussed the limitations, risks, security and privacy concerns of performing an evaluation and management service by telemedicine and the availability of in-person appointments. I also discussed with the patient that there may be a patient responsible charge related to this service. The patient expressed understanding and agreed to proceed.   Other persons participating in the visit and their role in the encounter: Patient, NP  Patient's location: Home. Provider's location: Clinic.  CHIEF COMPLAINT: Stage I colon cancer.  HPI- Mark Hopkins is a 35 year old male with past medical history significant for migraine and depression who is followed by Dr. Grayland Hopkins for stage I colon cancer.  He is status post right hemicolectomy on 05/20/2019.  0 out of 17 lymph nodes contained cancer.  Tumor is MSI stable.  INTERVAL HISTORY:  Patient reports doing well since his last visit.  Continues to have occasional GI upset and rectal bleeding.  He has not been seen by GI and knows that he needs to but due to insurance concerns he has not scheduled this appointment.  States he will get his insurance figured out ASAP.  REVIEW OF SYSTEMS:   Review of Systems  Constitutional: Negative.  Negative for chills, fever, malaise/fatigue and weight loss.  HENT:  Negative for congestion, ear pain and tinnitus.   Eyes: Negative.  Negative for blurred vision and double vision.  Respiratory: Negative.  Negative for cough, sputum production and  shortness of breath.   Cardiovascular: Negative.  Negative for chest pain, palpitations and leg swelling.  Gastrointestinal:  Positive for abdominal pain and blood in stool. Negative for constipation, diarrhea, nausea and vomiting.  Genitourinary:  Negative for dysuria, frequency and urgency.  Musculoskeletal:  Negative for back pain and falls.  Skin: Negative.  Negative for rash.  Neurological: Negative.  Negative for weakness and headaches.  Endo/Heme/Allergies: Negative.  Does not bruise/bleed easily.  Psychiatric/Behavioral: Negative.  Negative for depression. The patient is not nervous/anxious and does not have insomnia.    As per HPI. Otherwise, a complete review of systems is negative.  PAST MEDICAL HISTORY: Past Medical History:  Diagnosis Date   Abdominal pain    Change in bowel habits    Chronic back pain    Depression    Family history of breast cancer    Family history of colon cancer    Family history of colon cancer    Family history of lung cancer    Fatigue    Headache(784.0)    Migraine    Muscle pain    Nausea & vomiting    Night sweats    Rectal pain    Urinary tract infection     PAST SURGICAL HISTORY: Past Surgical History:  Procedure Laterality Date   LAPAROSCOPIC RIGHT HEMI COLECTOMY Right 05/20/2019   Procedure: LAPAROSCOPIC RIGHT HEMI COLECTOMY;  Surgeon: Alphonsa Overall, MD;  Location: [facility];  Service: General;  Laterality: Right;    FAMILY HISTORY: Family History  Problem Relation Age of Onset   Alcohol abuse Mother    Mental illness Mother    Heart  disease Mother        palpitations   Alcohol abuse Father    Heart disease Father 50       CAD   Alcohol abuse Maternal Grandmother    Breast cancer Maternal Grandmother        dx >50   Alcohol abuse Maternal Grandfather    Colon cancer Maternal Grandfather        dx >50   Alcohol abuse Paternal Grandmother    Alcohol abuse Paternal Grandfather    Lung cancer Paternal Grandfather     Liver disease Other    Kidney disease Other     ADVANCED DIRECTIVES (Y/N):  N  HEALTH MAINTENANCE: Social History   Tobacco Use   Smoking status: Never   Smokeless tobacco: Never  Vaping Use   Vaping Use: Never used  Substance Use Topics   Alcohol use: Not Currently    Comment: recovering alchoholic   Drug use: No     Colonoscopy:  PAP:  Bone density:  Lipid panel:  Allergies  Allergen Reactions   Morphine Rash   Chlorhexidine     Current Outpatient Medications  Medication Sig Dispense Refill   acetaminophen (TYLENOL) 500 MG tablet Take 1,000 mg by mouth every 6 (six) hours as needed for moderate pain.      Calcium Carbonate Antacid (ALKA-SELTZER ANTACID PO) Take 1 tablet by mouth daily as needed (heartburn).     lisinopril (ZESTRIL) 10 MG tablet lisinopril 10 mg tablet  TAKE ONE TABLET BY MOUTH ONE TIME DAILY     ondansetron (ZOFRAN ODT) 8 MG disintegrating tablet Take 1 tablet (8 mg total) by mouth every 8 (eight) hours as needed for nausea. 20 tablet 0   No current facility-administered medications for this visit.    OBJECTIVE: There were no vitals filed for this visit.   There is no height or weight on file to calculate BMI.    ECOG FS:0 - Asymptomatic  Physical Exam Constitutional:      Appearance: Normal appearance.  Neurological:     Mental Status: He is alert and oriented to person, place, and time.    LAB RESULTS:  Lab Results  Component Value Date   NA 137 03/22/2021   K 3.8 03/22/2021   CL 103 03/22/2021   CO2 23 03/22/2021   GLUCOSE 86 03/22/2021   BUN 9 03/22/2021   CREATININE 0.82 03/22/2021   CALCIUM 9.0 03/22/2021   PROT 7.9 03/22/2021   ALBUMIN 4.1 03/22/2021   AST 19 03/22/2021   ALT 19 03/22/2021   ALKPHOS 82 03/22/2021   BILITOT 0.7 03/22/2021   GFRNONAA >60 03/22/2021   GFRAA >60 03/29/2020    Lab Results  Component Value Date   WBC 9.8 03/22/2021   NEUTROABS 6.1 03/22/2021   HGB 15.3 03/22/2021   HCT 45.2 03/22/2021    MCV 86.1 03/22/2021   PLT 327 03/22/2021     STUDIES: No results found.  ASSESSMENT: Stage I colon cancer  PLAN:    1.  Stage I colon cancer-  Patient was diagnosed in November 2020 and had a partial colectomy.  No lymph nodes were positive for metastatic disease.  MSI stable.  Preop CEA was only mildly elevated at 3.1 but has since resolved and is 0.6.  Staging CT scan revealed questionable liver lesions but MRI on 08/06/2019 confirmed these are benign cysts and not cancer.  He is due for a repeat colonoscopy but due to insurance issues he has not had this  scheduled. He has been having some rectal bleeding for many months and knows he needs f/u with GI.  Lab work from 03/22/2021 is unremarkable.  2.  Abdominal pain-  Stable.  Follow-up with GI.   3.  Hypertension- He has been seen by cardiology and was started on lisinopril.  Developed a cough and was taken off.  Disposition- F/u with GI for colonoscopy.  RTC in 1 year for follow-up with labs (CEA, CBC, CMP) and assessment with Dr. Grayland Hopkins.  I provided 25 minutes of face-to-face video visit time during this encounter, and > 50% was spent counseling as documented under my assessment & plan.  Patient expressed understanding and was in agreement with this plan. He also understands that He can call clinic at any time with any questions, concerns, or complaints.   Cancer Staging Primary cancer of hepatic flexure of colon [facility]) Staging form: Colon and Rectum, AJCC 8th Edition - Clinical stage from 09/27/2019: Stage I (cT2, cN0, cM0) - Signed by Lloyd Huger, MD on 09/27/2019 Total positive nodes: 0   Jacquelin Hawking, NP   03/27/2021 12:43 PM

## 2021-09-11 IMAGING — CT CT CHEST W/ CM
2 of 3 series · 15 of 36 positions shown, 18 images · IV contrast (omnipaque)
Comparison: Abdominal CT 02/23/2019

CLINICAL DATA: Newly diagnosed colon cancer. Evaluate for
metastasis.

EXAM:
CT CHEST WITH CONTRAST
TECHNIQUE: Multidetector CT imaging of the chest was performed during
intravenous contrast administration.
CONTRAST:  75mL OMNIPAQUE IOHEXOL 300 MG/ML  SOLN

[Series 2: axial st · axial · 0.69mm/px · z∈[+129,+373]mm · 12 of 144 slices shown, 15 images]
[im 11/144  mediastinal]
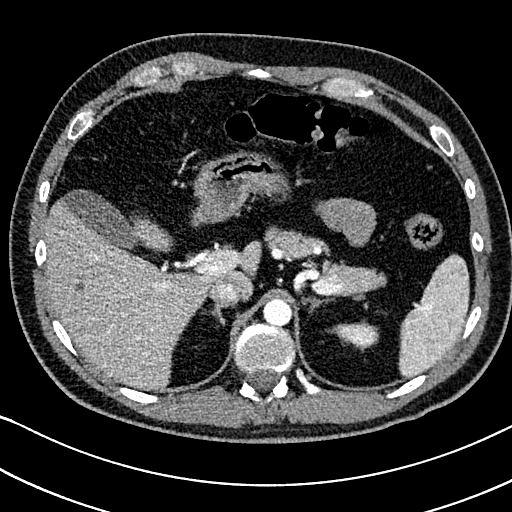
[im 11/144  lung]
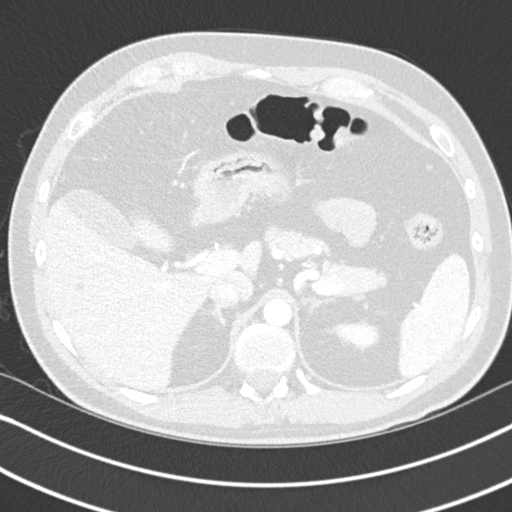
[im 22/144  lung]
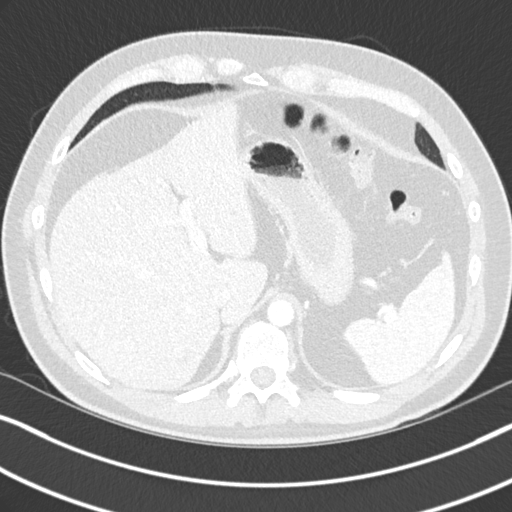
[im 32/144  lung]
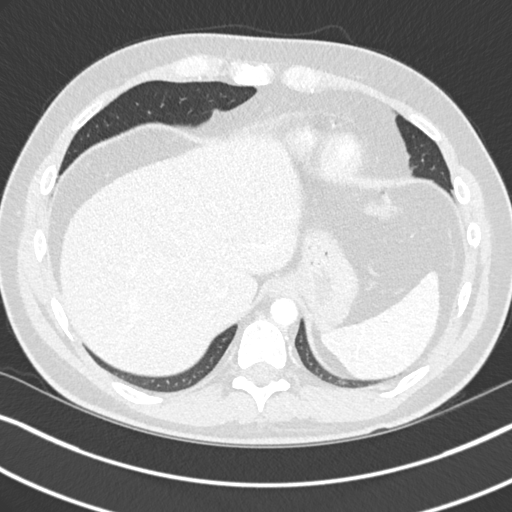
[im 43/144  lung]
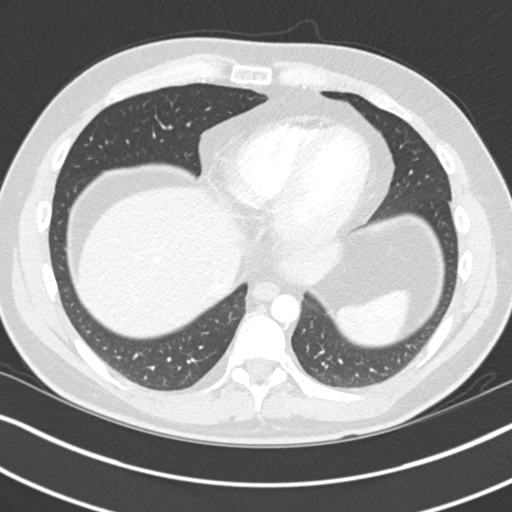
[im 53/144  mediastinal]
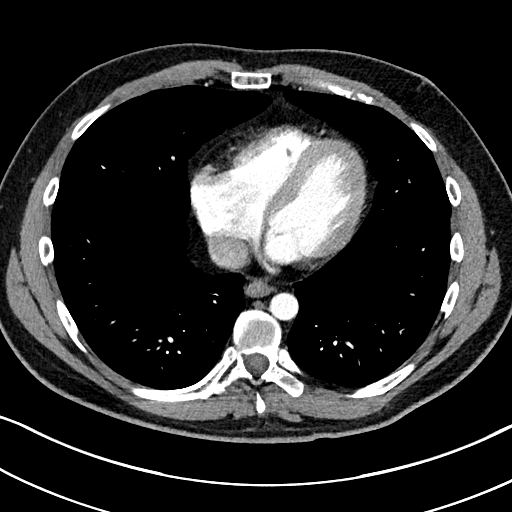
[im 53/144  lung]
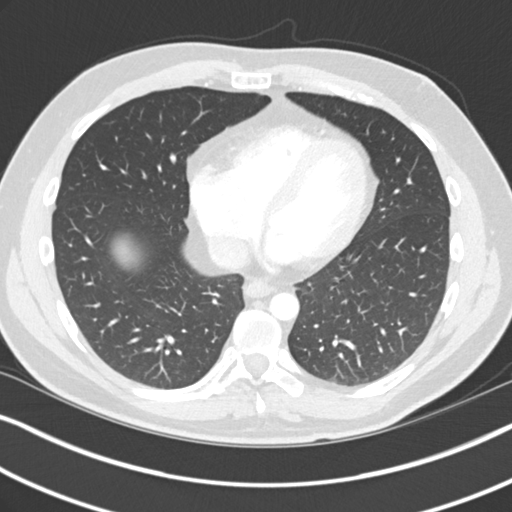
[im 64/144  lung]
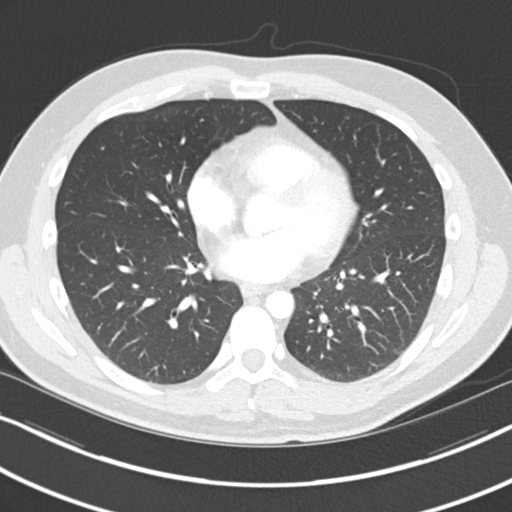
[im 80/144  lung]
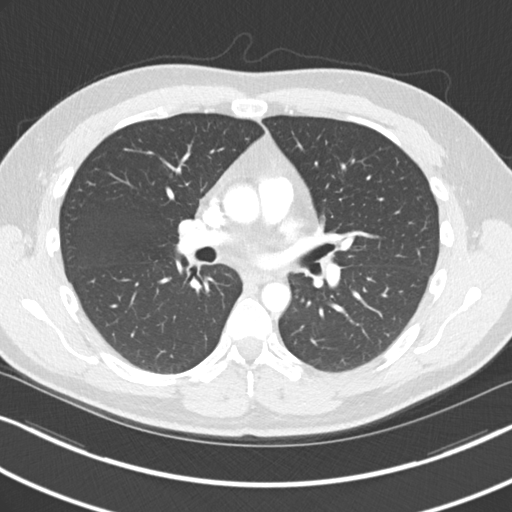
[im 91/144  lung]
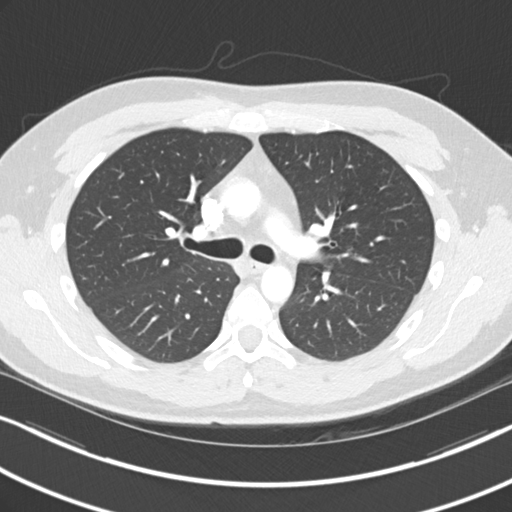
[im 101/144  mediastinal]
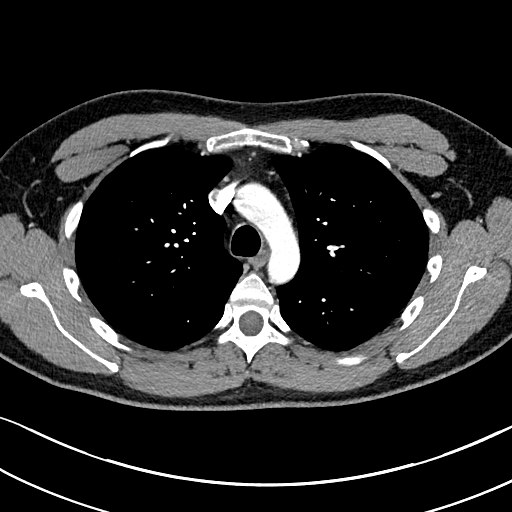
[im 101/144  lung]
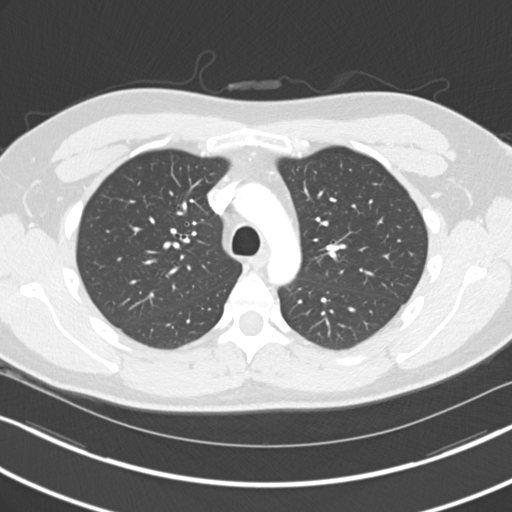
[im 112/144  lung]
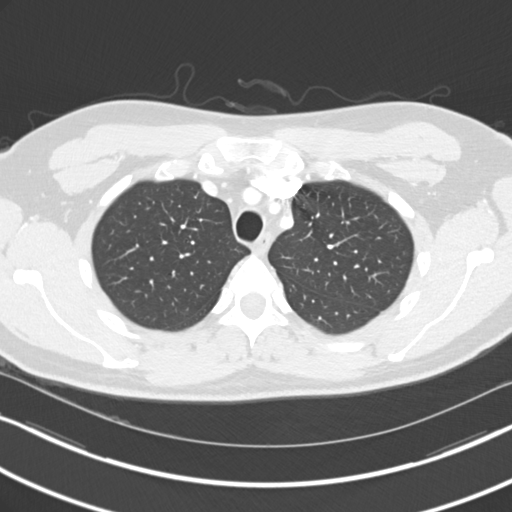
[im 122/144  lung]
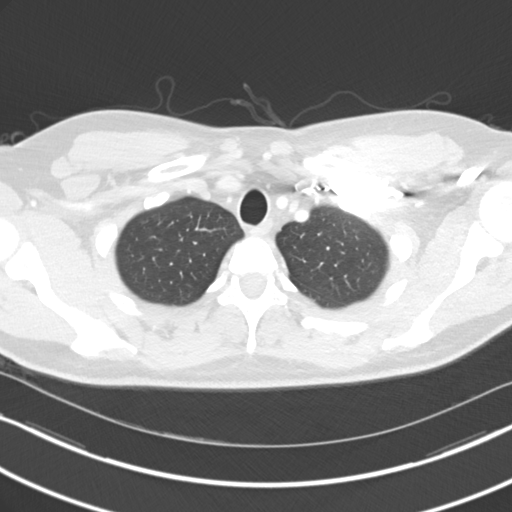
[im 133/144  lung]
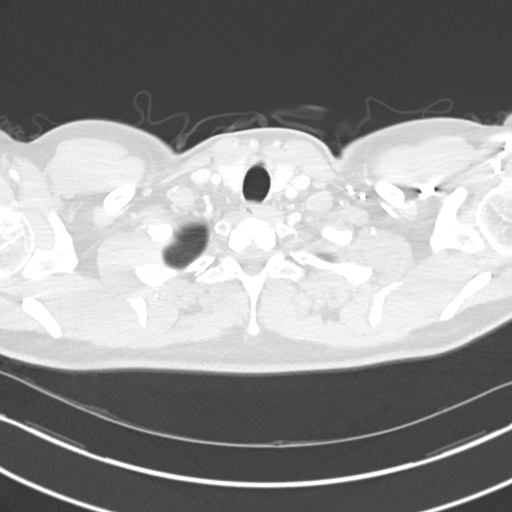

[Series 6: coronal · coronal · 0.59mm/px · 3 of 134 slices shown]
[im 27/134  lung]
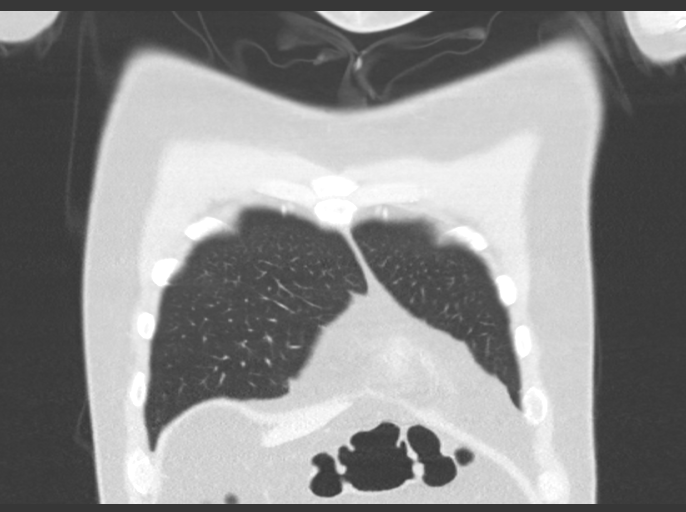
[im 54/134  lung]
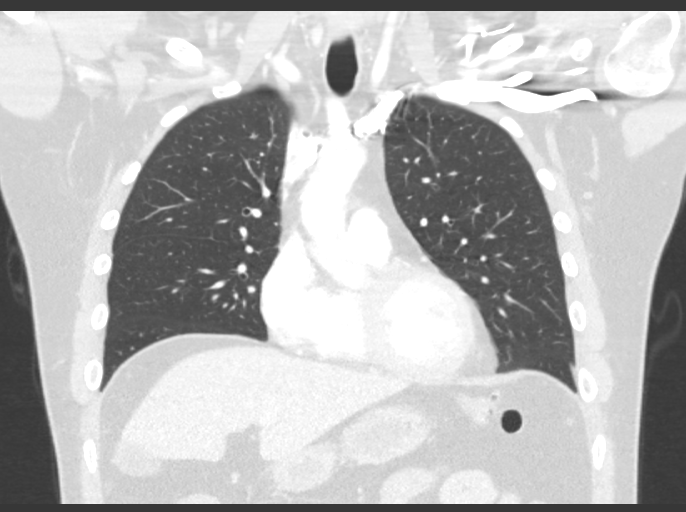
[im 80/134  lung]
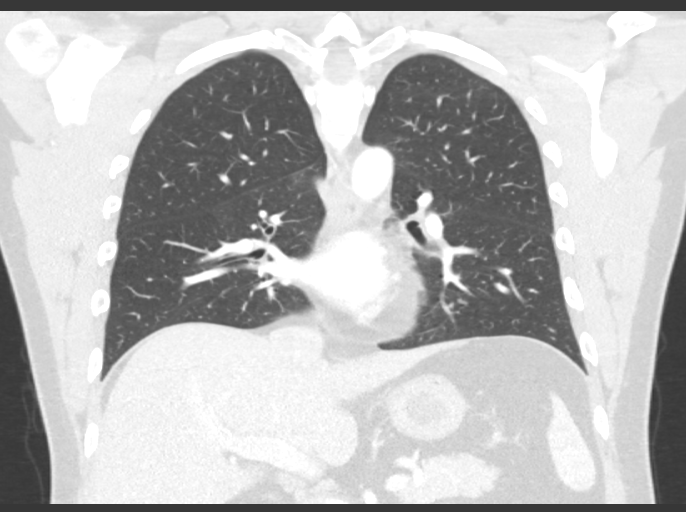

[15 of 36 positions shown; findings below may reference images not displayed]

FINDINGS: Cardiovascular: Normal caliber of the aorta and branch vessels.
Normal heart size, without pericardial effusion. No central
pulmonary embolism, on this non-dedicated study.

Mediastinum/Nodes: No supraclavicular adenopathy. No mediastinal or
hilar adenopathy.

Lungs/Pleura: No pleural fluid. Right lower lobe calcified granuloma
of 5 mm on 96/5.

No suspicious pulmonary nodule or mass.

Upper Abdomen: Well-circumscribed liver lesions on the order of 4 mm
are too small to characterize, but favored to represent cysts or
bile duct hamartomas. Normal imaged portions of the spleen, stomach,
pancreas, adrenal glands, kidneys. Small stones within the
gallbladder neck without acute cholecystitis. Proximal transverse
colonic primary is incompletely imaged on 144/2.

Musculoskeletal: No acute osseous abnormality.
IMPRESSION: No acute process or evidence of metastatic disease in the chest.

Cholelithiasis.

## 2022-03-25 ENCOUNTER — Inpatient Hospital Stay: Payer: BLUE CROSS/BLUE SHIELD

## 2022-03-26 ENCOUNTER — Telehealth: Payer: BLUE CROSS/BLUE SHIELD | Admitting: Oncology

## 2022-03-29 NOTE — Progress Notes (Signed)
  [place]  Telephone:(336) [phone] Fax:(336) [phone]  ID: Mark Hopkins OB: 1985/10/02  MR#: [id number]  ZHQ#:[id number]  Patient Care Team: Etter Sjogren as PCP - General (Physician Assistant) Irene Shipper, MD as Consulting Physician (Gastroenterology)    Lloyd Huger, MD   03/29/2022 9:07 AM    This encounter was created in error - please disregard.

## 2022-04-01 ENCOUNTER — Inpatient Hospital Stay: Payer: BLUE CROSS/BLUE SHIELD | Attending: Oncology

## 2022-04-01 ENCOUNTER — Other Ambulatory Visit: Payer: Self-pay

## 2022-04-01 DIAGNOSIS — C183 Malignant neoplasm of hepatic flexure: Secondary | ICD-10-CM

## 2022-04-02 ENCOUNTER — Inpatient Hospital Stay: Payer: BLUE CROSS/BLUE SHIELD | Admitting: Oncology

## 2022-04-04 ENCOUNTER — Telehealth: Payer: Self-pay

## 2022-04-04 ENCOUNTER — Inpatient Hospital Stay: Payer: BLUE CROSS/BLUE SHIELD | Admitting: Oncology

## 2022-04-04 DIAGNOSIS — C183 Malignant neoplasm of hepatic flexure: Secondary | ICD-10-CM

## 2022-04-04 NOTE — Telephone Encounter (Signed)
Called to go over chart for upcoming MyChart visit with Dr. Grayland Ormond this afternoon at 3:3. Patient did not answer. Left message informing patient to call back to go over chart or to r/s. Patient no cancelled labs on 9/25. Per Dr. Grayland Ormond ok to see patient and obtain labs after visit.

## 2022-04-18 NOTE — Progress Notes (Signed)
[place]  [phone] [phone]  ID: Mark Hopkins OB: 21-Oct-1985  MR#: [id number]  YQM#:[id number]  Patient Care Team: Etter Sjogren as PCP - General (Physician Assistant) Irene Shipper, MD as Consulting Physician (Gastroenterology)  I connected with Mark Hopkins on 04/26/22 at  3:30 PM EDT by video enabled telemedicine visit and verified that I am speaking with the correct person using two identifiers.   I discussed the limitations, risks, security and privacy concerns of performing an evaluation and management service by telemedicine and the availability of in-person appointments. I also discussed with the patient that there may be a patient responsible charge related to this service. The patient expressed understanding and agreed to proceed.   Other persons participating in the visit and their role in the encounter: Patient, MD.  Patient's location: Home. Provider's location: Clinic.  CHIEF COMPLAINT: Stage I colon cancer.  INTERVAL HISTORY: Patient agreed to video assisted telemedicine visit for repeat laboratory can routine yearly evaluation.  He has not had a repeat colonoscopy secondary to switching insurances and having a difficult time getting approval.  He continues to have difficulty with some foods, but this is improving.  He otherwise feels well.  He has no neurologic complaints.  He denies any recent fevers or illnesses.  He has a good appetite and denies weight loss.  He has no chest pain, shortness of breath, cough, or hemoptysis.  He denies any nausea, vomiting, constipation, or diarrhea.  He denies any melena or hematochezia.  He has no changes in his bowel movements.  He has no urinary complaints.  Patient offers no further specific complaints today.  REVIEW OF SYSTEMS:   Review of Systems  Constitutional: Negative.  Negative for fever, malaise/fatigue and weight loss.  Respiratory: Negative.  Negative  for cough, hemoptysis and shortness of breath.   Cardiovascular: Negative.  Negative for chest pain and leg swelling.  Gastrointestinal: Negative.  Negative for abdominal pain, blood in stool, constipation, diarrhea, nausea and vomiting.  Genitourinary: Negative.  Negative for dysuria.  Musculoskeletal: Negative.  Negative for back pain.  Skin: Negative.  Negative for rash.  Neurological: Negative.  Negative for dizziness, focal weakness, weakness and headaches.  Psychiatric/Behavioral: Negative.  The patient is not nervous/anxious.     As per HPI. Otherwise, a complete review of systems is negative.  PAST MEDICAL HISTORY: Past Medical History:  Diagnosis Date   Abdominal pain    Change in bowel habits    Chronic back pain    Depression    Family history of breast cancer    Family history of colon cancer    Family history of colon cancer    Family history of lung cancer    Fatigue    Headache(784.0)    Migraine    Muscle pain    Nausea & vomiting    Night sweats    Rectal pain    Urinary tract infection     PAST SURGICAL HISTORY: Past Surgical History:  Procedure Laterality Date   LAPAROSCOPIC RIGHT HEMI COLECTOMY Right 05/20/2019   Procedure: LAPAROSCOPIC RIGHT HEMI COLECTOMY;  Surgeon: Alphonsa Overall, MD;  Location: [facility];  Service: General;  Laterality: Right;    FAMILY HISTORY: Family History  Problem Relation Age of Onset   Alcohol abuse Mother    Mental illness Mother    Heart disease Mother        palpitations   Alcohol abuse Father    Heart disease Father  74       CAD   Alcohol abuse Maternal Grandmother    Breast cancer Maternal Grandmother        dx >50   Alcohol abuse Maternal Grandfather    Colon cancer Maternal Grandfather        dx >50   Alcohol abuse Paternal Grandmother    Alcohol abuse Paternal Grandfather    Lung cancer Paternal Grandfather    Liver disease Other    Kidney disease Other     ADVANCED DIRECTIVES (Y/N):  N  HEALTH  MAINTENANCE: Social History   Tobacco Use   Smoking status: Never   Smokeless tobacco: Never  Vaping Use   Vaping Use: Never used  Substance Use Topics   Alcohol use: Not Currently    Comment: recovering alchoholic   Drug use: No     Colonoscopy:  PAP:  Bone density:  Lipid panel:  Allergies  Allergen Reactions   Morphine Rash   Chlorhexidine     Current Outpatient Medications  Medication Sig Dispense Refill   losartan (COZAAR) 100 MG tablet Take 1 tablet by mouth daily.     pantoprazole (PROTONIX) 40 MG tablet Take 1 tablet by mouth daily.     acetaminophen (TYLENOL) 500 MG tablet Take 1,000 mg by mouth every 6 (six) hours as needed for moderate pain.      acetaminophen (TYLENOL) 500 MG tablet Take by mouth.     Calcium Carbonate Antacid (ALKA-SELTZER ANTACID PO) Take 1 tablet by mouth daily as needed (heartburn).     lisinopril (ZESTRIL) 10 MG tablet lisinopril 10 mg tablet  TAKE ONE TABLET BY MOUTH ONE TIME DAILY     ondansetron (ZOFRAN ODT) 8 MG disintegrating tablet Take 1 tablet (8 mg total) by mouth every 8 (eight) hours as needed for nausea. 20 tablet 0   No current facility-administered medications for this visit.    OBJECTIVE: There were no vitals filed for this visit.   There is no height or weight on file to calculate BMI.    ECOG FS:0 - Asymptomatic  General: Well-developed, well-nourished, no acute distress. HEENT: Normocephalic. Neuro: Alert, answering all questions appropriately. Cranial nerves grossly intact. Psych: Normal affect.   LAB RESULTS:  Lab Results  Component Value Date   NA 137 03/22/2021   K 3.8 03/22/2021   CL 103 03/22/2021   CO2 23 03/22/2021   GLUCOSE 86 03/22/2021   BUN 9 03/22/2021   CREATININE 0.82 03/22/2021   CALCIUM 9.0 03/22/2021   PROT 7.9 03/22/2021   ALBUMIN 4.1 03/22/2021   AST 19 03/22/2021   ALT 19 03/22/2021   ALKPHOS 82 03/22/2021   BILITOT 0.7 03/22/2021   GFRNONAA >60 03/22/2021   GFRAA >60 03/29/2020     Lab Results  Component Value Date   WBC 9.8 03/22/2021   NEUTROABS 6.1 03/22/2021   HGB 15.3 03/22/2021   HCT 45.2 03/22/2021   MCV 86.1 03/22/2021   PLT 327 03/22/2021     STUDIES: No results found.  ASSESSMENT: Stage I colon cancer  PLAN:    1.  Stage I colon cancer: Diagnosed on May 20, 2019 at the time of a partial colectomy.  0 of 17 lymph nodes positive for disease.  MSI stable.  Preop CEA was 3.1, but his most recent result is 0.4.  Initial staging CT scan revealed questionable lesions in his liver, but MRI on August 06, 2019 confirmed these to be benign cysts and not metastatic disease.  Patient reports  repeat colonoscopy was within normal limits, but we do not have these results at this time.  He has been seen by genetic counseling, but results are unknown at this time.  No intervention is needed at this time.  Continue yearly lab work and evaluation until patient is 5 years removed from his surgery in November 2025.  Return to clinic in 1 year with video assisted telemedicine visit.     I provided 20 minutes of face-to-face video visit time during this encounter which included chart review, counseling, and coordination of care as documented above.   Patient expressed understanding and was in agreement with this plan. He also understands that He can call clinic at any time with any questions, concerns, or complaints.    Cancer Staging  Primary cancer of hepatic flexure of colon [facility]) Staging form: Colon and Rectum, AJCC 8th Edition - Clinical stage from 09/27/2019: Stage I (cT2, cN0, cM0) - Signed by Lloyd Huger, MD on 09/27/2019 Total positive nodes: 0   Lloyd Huger, MD   04/26/2022 8:17 AM

## 2022-04-25 ENCOUNTER — Inpatient Hospital Stay: Payer: BLUE CROSS/BLUE SHIELD | Attending: Oncology

## 2022-04-25 ENCOUNTER — Inpatient Hospital Stay (HOSPITAL_BASED_OUTPATIENT_CLINIC_OR_DEPARTMENT_OTHER): Payer: BLUE CROSS/BLUE SHIELD | Admitting: Oncology

## 2022-04-25 DIAGNOSIS — C183 Malignant neoplasm of hepatic flexure: Secondary | ICD-10-CM

## 2022-10-23 ENCOUNTER — Other Ambulatory Visit: Payer: Self-pay

## 2022-10-23 ENCOUNTER — Emergency Department (HOSPITAL_COMMUNITY)
Admission: EM | Admit: 2022-10-23 | Discharge: 2022-10-23 | Disposition: A | Discharge status: Home / Self Care | Payer: BLUE CROSS/BLUE SHIELD | Attending: Emergency Medicine | Admitting: Emergency Medicine

## 2022-10-23 ENCOUNTER — Emergency Department (HOSPITAL_COMMUNITY): Payer: BLUE CROSS/BLUE SHIELD

## 2022-10-23 DIAGNOSIS — W1849XA Other slipping, tripping and stumbling without falling, initial encounter: Secondary | ICD-10-CM | POA: Diagnosis not present

## 2022-10-23 DIAGNOSIS — S93491A Sprain of other ligament of right ankle, initial encounter: Secondary | ICD-10-CM | POA: Diagnosis not present

## 2022-10-23 DIAGNOSIS — M25571 Pain in right ankle and joints of right foot: Secondary | ICD-10-CM | POA: Diagnosis present

## 2022-10-23 NOTE — Discharge Instructions (Signed)
Take 4 over the counter ibuprofen tablets 3 times a day or 2 over-the-counter naproxen tablets twice a day for pain. Also take tylenol 1000mg (2 extra strength) four times a day.    Try to keep your weight off the ankle as best you can.  Whenever you are not up moving around try to elevate the ankle above your heart.

## 2022-10-23 NOTE — ED Provider Triage Note (Signed)
Emergency Medicine Provider Triage Evaluation Note  Mark Hopkins , a 37 y.o. male  was evaluated in triage.  Pt complains of right ankle pain.  Patient states that he was walking down steps approximately 2 weeks ago and rolled his ankle inward causing a popping sensation and secondary pain.  Patient states that he has been trying to walk around on affected foot since then with continued pain.  States that swelling has remained persistent since then.  Denies any repeat trauma.   Review of Systems  Positive: See above Negative:   Physical Exam  BP (!) 151/93   Pulse 91   Temp 98.1 F (36.7 C) (Oral)   Resp 14   SpO2 100%  Gen:   Awake, no distress   Resp:  Normal effort  MSK:   Moves extremities without difficulty  Other:  Diffuse swelling of dorsal lateral aspect of right foot with tenderness to palpation.  Pedal pulses 2+ bilaterally.  Patient with full range of motion of ankle dorsi/plantarflexion but with pain.  Medical Decision Making  Medically screening exam initiated at 9:48 AM.  Appropriate orders placed.  Mark Hopkins was informed that the remainder of the evaluation will be completed by another provider, this initial triage assessment does not replace that evaluation, and the importance of remaining in the ED until their evaluation is complete.     Peter Garter, [place] 10/23/22 [phone]

## 2022-10-23 NOTE — ED Triage Notes (Addendum)
Pt came in via POV d/t falling down steps 2 weeks ago & landing on his Rt ankle & heard "popping." Pt states the swelling has not went down & it still hurts 3/10 pain & her for evaluation.

## 2022-10-23 NOTE — ED Provider Notes (Signed)
[facility] EMERGENCY DEPARTMENT AT [facility] Provider Note   CSN: [id number] Arrival date & time: 10/23/22  7829     History  Chief Complaint  Patient presents with   Ankle Pain    Mark Hopkins is a 37 y.o. male.  37 yo M with a chief complaints of right ankle pain.  About 2 weeks ago he missed a step and fell like he inverted his ankle and then fell on top of it.  Since then has been ambulating on it but with discomfort.  He denies any other specific injury.  Feels like the swelling and pain persisted and so is here for evaluation.   Ankle Pain      Home Medications Prior to Admission medications   Medication Sig Start Date End Date Taking? Authorizing Provider  acetaminophen (TYLENOL) 500 MG tablet Take 1,000 mg by mouth every 6 (six) hours as needed for moderate pain.     [provider]  acetaminophen (TYLENOL) 500 MG tablet Take by mouth.    [provider]  Calcium Carbonate Antacid (ALKA-SELTZER ANTACID PO) Take 1 tablet by mouth daily as needed (heartburn).    [provider]  lisinopril (ZESTRIL) 10 MG tablet lisinopril 10 mg tablet  TAKE ONE TABLET BY MOUTH ONE TIME DAILY    [provider]  losartan (COZAAR) 100 MG tablet Take 1 tablet by mouth daily. 10/26/21   [provider]  ondansetron (ZOFRAN ODT) 8 MG disintegrating tablet Take 1 tablet (8 mg total) by mouth every 8 (eight) hours as needed for nausea. 06/12/18   Derwood Kaplan, MD  pantoprazole (PROTONIX) 40 MG tablet Take 1 tablet by mouth daily. 08/22/21   [provider]      Allergies    Morphine and Chlorhexidine    Review of Systems   Review of Systems  Physical Exam Updated Vital Signs BP (!) 151/93   Pulse 91   Temp 98.1 F (36.7 C) (Oral)   Resp 14   SpO2 100%  Physical Exam Vitals and nursing note reviewed.  Constitutional:      Appearance: He is well-developed.  HENT:     Head: Normocephalic and atraumatic.   Eyes:     Pupils: Pupils are equal, round, and reactive to light.  Neck:     Vascular: No JVD.  Cardiovascular:     Rate and Rhythm: Normal rate and regular rhythm.     Heart sounds: No murmur heard.    No friction rub. No gallop.  Pulmonary:     Effort: No respiratory distress.     Breath sounds: No wheezing.  Abdominal:     General: There is no distension.     Tenderness: There is no abdominal tenderness. There is no guarding or rebound.  Musculoskeletal:        General: Normal range of motion.     Cervical back: Normal range of motion and neck supple.     Comments: Significant edema about the right ankle.  Pulse motor and sensation intact distally.  Pain mostly along the anterior talofibular ligament.  No obvious pain at the fibular neck.  No pain at the base of the fifth metatarsal or the navicular.  Skin:    Coloration: Skin is not pale.     Findings: No rash.  Neurological:     Mental Status: He is alert and oriented to person, place, and time.  Psychiatric:        Behavior: Behavior normal.  ED Results / Procedures / Treatments   Labs (all labs ordered are listed, but only abnormal results are displayed) Labs Reviewed - No data to display  EKG None  Radiology DG Ankle Complete Right  Result Date: 10/23/2022 CLINICAL DATA:  Pain. Fell 2 weeks ago. Right ankle and foot pain and swelling. Bruising proximal aspect of toes. EXAM: RIGHT ANKLE - COMPLETE 3+ VIEW; RIGHT FOOT COMPLETE - 3+ VIEW COMPARISON:  None Available. FINDINGS: Right ankle: The ankle mortise is symmetric and intact. Joint spaces are preserved. No acute fracture or dislocation. Right foot: Joint spaces are preserved. The cortices are intact. No acute fracture or dislocation. IMPRESSION: Normal right ankle and foot radiographs. Electronically Signed   By: Neita Garnet M.D.   On: 10/23/2022 10:29   DG Foot Complete Right  Result Date: 10/23/2022 CLINICAL DATA:  Pain. Fell 2 weeks ago. Right ankle and  foot pain and swelling. Bruising proximal aspect of toes. EXAM: RIGHT ANKLE - COMPLETE 3+ VIEW; RIGHT FOOT COMPLETE - 3+ VIEW COMPARISON:  None Available. FINDINGS: Right ankle: The ankle mortise is symmetric and intact. Joint spaces are preserved. No acute fracture or dislocation. Right foot: Joint spaces are preserved. The cortices are intact. No acute fracture or dislocation. IMPRESSION: Normal right ankle and foot radiographs. Electronically Signed   By: Neita Garnet M.D.   On: 10/23/2022 10:29    Procedures Procedures    Medications Ordered in ED Medications - No data to display  ED Course/ Medical Decision Making/ A&P                             Medical Decision Making  37 yo M with a chief complaint of right ankle pain.  This is after an inversion injury about 2 weeks ago.  He has significant pain and swelling to the ankle.  Plain film independently interpreted by me without fracture or dislocation.  Most likely this is a sprain.  ASO crutches PCP follow-up.  11:55 AM:  I have discussed the diagnosis/risks/treatment options with the patient.  Evaluation and diagnostic testing in the emergency department does not suggest an emergent condition requiring admission or immediate intervention beyond what has been performed at this time.  They will follow up with PCP. We also discussed returning to the ED immediately if new or worsening sx occur. We discussed the sx which are most concerning (e.g., sudden worsening pain, fever, inability to tolerate by mouth) that necessitate immediate return. Medications administered to the patient during their visit and any new prescriptions provided to the patient are listed below.  Medications given during this visit Medications - No data to display   The patient appears reasonably screen and/or stabilized for discharge and I doubt any other medical condition or other [facility] requiring further screening, evaluation, or treatment in the ED at this time prior  to discharge.          Final Clinical Impression(s) / ED Diagnoses Final diagnoses:  Sprain of anterior talofibular ligament of right ankle, initial encounter    Rx / DC Orders ED Discharge Orders     None         Melene Plan, DO 10/23/22 1155

## 2023-04-24 ENCOUNTER — Other Ambulatory Visit: Payer: Self-pay | Admitting: *Deleted

## 2023-04-24 DIAGNOSIS — C183 Malignant neoplasm of hepatic flexure: Secondary | ICD-10-CM

## 2023-04-25 ENCOUNTER — Inpatient Hospital Stay: Payer: BLUE CROSS/BLUE SHIELD | Attending: Oncology

## 2023-04-29 ENCOUNTER — Inpatient Hospital Stay: Payer: BLUE CROSS/BLUE SHIELD | Admitting: Oncology

## 2023-05-05 ENCOUNTER — Inpatient Hospital Stay: Payer: BLUE CROSS/BLUE SHIELD

## 2023-05-06 ENCOUNTER — Inpatient Hospital Stay: Payer: BLUE CROSS/BLUE SHIELD | Admitting: Oncology
